# Patient Record
Sex: Female | Born: 1945 | Race: White | Hispanic: No | Smoking: Former smoker
Health system: Southern US, Community
[De-identification: ages and names within clinical notes are randomized; demographics above are authoritative.]

## PROBLEM LIST (undated history)

## (undated) DIAGNOSIS — I10 Essential (primary) hypertension: Secondary | ICD-10-CM

## (undated) DIAGNOSIS — R42 Dizziness and giddiness: Secondary | ICD-10-CM

## (undated) DIAGNOSIS — G8929 Other chronic pain: Secondary | ICD-10-CM

## (undated) DIAGNOSIS — M549 Dorsalgia, unspecified: Secondary | ICD-10-CM

## (undated) HISTORY — DX: Dizziness and giddiness: R42

## (undated) HISTORY — PX: TONSILLECTOMY AND ADENOIDECTOMY: SHX28

## (undated) HISTORY — PX: ABDOMINAL HYSTERECTOMY: SHX81

## (undated) HISTORY — DX: Essential (primary) hypertension: I10

---

## 2005-06-19 ENCOUNTER — Ambulatory Visit: Payer: Self-pay | Admitting: Internal Medicine

## 2005-06-19 ENCOUNTER — Ambulatory Visit (HOSPITAL_COMMUNITY): Admission: RE | Admit: 2005-06-19 | Discharge: 2005-06-19 | Payer: Self-pay | Admitting: Internal Medicine

## 2005-06-20 ENCOUNTER — Encounter (INDEPENDENT_AMBULATORY_CARE_PROVIDER_SITE_OTHER): Payer: Self-pay | Admitting: Internal Medicine

## 2005-07-04 ENCOUNTER — Ambulatory Visit: Payer: Self-pay | Admitting: Internal Medicine

## 2005-07-11 ENCOUNTER — Ambulatory Visit (HOSPITAL_COMMUNITY): Admission: RE | Admit: 2005-07-11 | Discharge: 2005-07-11 | Payer: Self-pay | Admitting: Internal Medicine

## 2005-07-25 ENCOUNTER — Ambulatory Visit: Payer: Self-pay | Admitting: Internal Medicine

## 2005-08-15 ENCOUNTER — Ambulatory Visit: Payer: Self-pay | Admitting: Internal Medicine

## 2005-08-28 ENCOUNTER — Ambulatory Visit: Payer: Self-pay | Admitting: *Deleted

## 2007-08-12 ENCOUNTER — Telehealth (INDEPENDENT_AMBULATORY_CARE_PROVIDER_SITE_OTHER): Payer: Self-pay | Admitting: *Deleted

## 2007-08-18 ENCOUNTER — Encounter (INDEPENDENT_AMBULATORY_CARE_PROVIDER_SITE_OTHER): Payer: Self-pay | Admitting: Internal Medicine

## 2007-08-18 DIAGNOSIS — J309 Allergic rhinitis, unspecified: Secondary | ICD-10-CM | POA: Insufficient documentation

## 2007-08-18 DIAGNOSIS — F411 Generalized anxiety disorder: Secondary | ICD-10-CM | POA: Insufficient documentation

## 2013-05-23 ENCOUNTER — Emergency Department (HOSPITAL_COMMUNITY): Payer: Medicare Other

## 2013-05-23 ENCOUNTER — Emergency Department (HOSPITAL_COMMUNITY)
Admission: EM | Admit: 2013-05-23 | Discharge: 2013-05-23 | Discharge status: Against Medical Advice | Payer: Medicare Other | Attending: Emergency Medicine | Admitting: Emergency Medicine

## 2013-05-23 ENCOUNTER — Encounter (HOSPITAL_COMMUNITY): Payer: Self-pay | Admitting: Emergency Medicine

## 2013-05-23 DIAGNOSIS — R5381 Other malaise: Secondary | ICD-10-CM | POA: Insufficient documentation

## 2013-05-23 DIAGNOSIS — R209 Unspecified disturbances of skin sensation: Secondary | ICD-10-CM | POA: Insufficient documentation

## 2013-05-23 DIAGNOSIS — R5383 Other fatigue: Secondary | ICD-10-CM | POA: Insufficient documentation

## 2013-05-23 DIAGNOSIS — Z87891 Personal history of nicotine dependence: Secondary | ICD-10-CM | POA: Insufficient documentation

## 2013-05-23 DIAGNOSIS — R42 Dizziness and giddiness: Secondary | ICD-10-CM

## 2013-05-23 DIAGNOSIS — R51 Headache: Secondary | ICD-10-CM | POA: Insufficient documentation

## 2013-05-23 DIAGNOSIS — I6509 Occlusion and stenosis of unspecified vertebral artery: Secondary | ICD-10-CM | POA: Insufficient documentation

## 2013-05-23 LAB — COMPREHENSIVE METABOLIC PANEL
Calcium: 9.5 mg/dL (ref 8.4–10.5)
Chloride: 102 mEq/L (ref 96–112)
GFR calc Af Amer: >90 mL/min (ref >90)
Potassium: 4 mEq/L (ref 3.5–5.1)
Sodium: 139 mEq/L (ref 135–145)
Total Protein: 7.5 g/dL (ref 6.0–8.3)

## 2013-05-23 LAB — CBC
MCHC: 34.4 g/dL (ref 30.0–36.0)
MCV: 92.1 fL (ref 78.0–100.0)
Platelets: 265 10*3/uL (ref 150–400)
RDW: 13.8 % (ref 11.5–15.5)

## 2013-05-23 LAB — POCT I-STAT, CHEM 8
BUN: 18 mg/dL (ref 6–23)
Calcium, Ion: 1.27 mmol/L (ref 1.13–1.30)
Chloride: 106 mEq/L (ref 96–112)
Hemoglobin: 14.3 g/dL (ref 12.0–15.0)
Sodium: 140 mEq/L (ref 135–145)

## 2013-05-23 LAB — GLUCOSE, CAPILLARY: Glucose-Capillary: 87 mg/dL (ref 70–99)

## 2013-05-23 LAB — DIFFERENTIAL
Basophils Relative: 1 % (ref 0–1)
Eosinophils Absolute: 0.1 10*3/uL (ref 0.0–0.7)
Eosinophils Relative: 2 % (ref 0–5)
Lymphocytes Relative: 43 % (ref 12–46)

## 2013-05-23 LAB — POCT I-STAT TROPONIN I: Troponin i, poc: 0 ng/mL (ref 0.00–0.08)

## 2013-05-23 LAB — PROTIME-INR: Prothrombin Time: 13.3 seconds (ref 11.6–15.2)

## 2013-05-23 LAB — TROPONIN I: Troponin I: <0.3 ng/mL (ref <0.30)

## 2013-05-23 MED ORDER — GADOBENATE DIMEGLUMINE 529 MG/ML IV SOLN
13.0000 mL | Freq: Once | INTRAVENOUS | Status: AC | PRN
  Administered 2013-05-23: 13 mL via INTRAVENOUS

## 2013-05-23 MED ORDER — MECLIZINE HCL 12.5 MG PO TABS: 12.5000 mg | ORAL_TABLET | Freq: Three times a day (TID) | ORAL | Status: DC | PRN

## 2013-05-23 NOTE — ED Provider Notes (Signed)
History     CSN: [id number]  Arrival date & time 05/23/13  4098   First MD Initiated Contact with Patient 05/23/13 1122      Chief Complaint  Patient presents with  . Dizziness    (Consider location/radiation/quality/duration/timing/severity/associated sxs/prior treatment) HPI Comments: Patient reports sudden onset headache 1 week ago she was walking. This lasted a couple minutes and was followed by a feeling of her balance being off with left arm numbness and tingling. She's had difficulty with her balance for the past week and intermittent headaches that come and go. She states the headache was sudden onset and felt like a "explosion in my head." She denies any difficulty talking or swallowing. She denies any focal weakness. She denies any vertigo. She states she feels off balance and has a difficult time walking. The feeling of "off balance" only occurs if she walks a long distance. The headaches have been intermittent since Saturday as well. She denies any other medical conditions and is not have a Dr.  The history is provided by the patient and a relative.    History reviewed. No pertinent past medical history.  History reviewed. No pertinent past surgical history.  No family history on file.  History  Substance Use Topics  . Smoking status: Former Games developer  . Smokeless tobacco: Not on file  . Alcohol Use: No    OB History   Grav Para Term Preterm Abortions TAB SAB Ect Mult Living                  Review of Systems  Constitutional: Negative for fever, activity change and appetite change.  HENT: Negative for congestion and rhinorrhea.   Respiratory: Negative for cough, chest tightness and shortness of breath.   Cardiovascular: Negative for chest pain.  Gastrointestinal: Negative for nausea, vomiting and abdominal pain.  Genitourinary: Negative for dysuria and hematuria.  Musculoskeletal: Negative for back pain.  Skin: Negative for pallor.  Neurological: Positive for  dizziness, weakness, light-headedness, numbness and headaches. Negative for syncope.  A complete 10 system review of systems was obtained and all systems are negative except as noted in the HPI and PMH.    Allergies  Fluticasone propionate  Home Medications   Current Outpatient Rx  Name  Route  Sig  Dispense  Refill  . Ascorbic Acid (VITAMIN C PO)   Oral   Take 1 tablet by mouth daily.         Marland Kitchen CALCIUM-VITAMIN D PO   Oral   Take 1 tablet by mouth daily.         Marland Kitchen MAGNESIUM PO   Oral   Take 1 tablet by mouth daily.         . Multiple Vitamin (MULTIVITAMIN WITH MINERALS) TABS   Oral   Take 1 tablet by mouth daily.         . meclizine (ANTIVERT) 12.5 MG tablet   Oral   Take 1 tablet (12.5 mg total) by mouth 3 (three) times daily as needed.   30 tablet   0     BP 147/86  Pulse 72  Temp(Src) 98.6 F (37 C) (Oral)  Resp 18  SpO2 98%  Physical Exam  Constitutional: She is oriented to person, place, and time. She appears well-developed and well-nourished. No distress.  HENT:  Head: Normocephalic and atraumatic.  Mouth/Throat: Oropharynx is clear and moist. No oropharyngeal exudate.  Eyes: Conjunctivae and EOM are normal. Pupils are equal, round, and reactive to light.  Neck: Normal range of motion. Neck supple.  Cardiovascular: Normal rate, regular rhythm and normal heart sounds.   No murmur heard. Pulmonary/Chest: Effort normal and breath sounds normal. No respiratory distress.  Abdominal: Soft. There is no tenderness. There is no rebound and no guarding.  Musculoskeletal: Normal range of motion. She exhibits no edema and no tenderness.  Neurological: She is alert and oriented to person, place, and time. No cranial nerve deficit. She exhibits normal muscle tone. Coordination normal.  CN 2-12 intact, no ataxia on finger to nose, no nystagmus, 5/5 strength throughout, no pronator drift, Romberg negative, normal gait.   Skin: Skin is warm.    ED Course   Procedures (including critical care time)  Labs Reviewed  COMPREHENSIVE METABOLIC PANEL - Abnormal; Notable for the following:    GFR calc non Af Amer 88 (*)    All other components within normal limits  POCT I-STAT, CHEM 8 - Abnormal; Notable for the following:    Glucose, Bld 100 (*)    All other components within normal limits  GLUCOSE, CAPILLARY  ETHANOL  PROTIME-INR  APTT  CBC  DIFFERENTIAL  TROPONIN I  URINE RAPID DRUG SCREEN (HOSP PERFORMED)  URINALYSIS, ROUTINE W REFLEX MICROSCOPIC  POCT I-STAT TROPONIN I   Ct Head Wo Contrast  05/23/2013   *RADIOLOGY REPORT*  Clinical Data:  Right-sided headache, dizziness, and unsteady gait.  CT HEAD WITHOUT CONTRAST  Technique: Contiguous axial images were obtained from the base of the skull through the vertex without contrast  Comparison: None  Findings:  There is no evidence of intracranial hemorrhage, brain edema, or other signs of acute infarction.  There is no evidence of intracranial mass lesion or mass effect.  No abnormal extraaxial fluid collections are identified.  There is no evidence of hydrocephalus, or other significant intracranial abnormality.  No skull abnormality identified.  IMPRESSION: Negative non-contrast head CT.   Original Report Authenticated By: Myles Rosenthal, M.D.   Mr [facility] Wo Contrast  05/23/2013   *RADIOLOGY REPORT*  Clinical Data:  Explosive sound right-side of head 1 week ago. Dizziness, headache and unsteady gait since.  MRI BRAIN WITH AND WITHOUT CONTRAST MRA HEAD WITHOUT CONTRAST MRA NECK WITHOUT AND WITH CONTRAST  Technique: Multiplanar, multiecho pulse sequences of the brain and surrounding structures were obtained according to standard protocol with and without intravenous contrast.  Angiographic images of the Circle of Willis were obtained using MRA technique without intravenous contrast.  Angiographic images of the neck were obtained using MRA technique without and with intravenous contrast.  Contrast:13 ml  MultiHance.  Comparison:05/23/2013 head CT.  MRI HEAD  Findings: No acute infarct.  No intracranial hemorrhage.  No intracranial mass or abnormal enhancement.  No hydrocephalus.  Moderate patchy and punctate nonspecific white matter type changes probably related to result of small vessel disease.  Polypoid mucosal thickening maxillary sinuses.  Mild mucosal thickening ethmoid sinus air cells.  Minimal mucosal thickening frontal sinuses.  Polypoid opacification posterior aspect right middle turbinate extends posteriorly probably incidental finding although a small polyp is not excluded.  Cervical medullary junction, pituitary region, pineal region and orbital structures unremarkable.  IMPRESSION: No acute infarct.  No intracranial hemorrhage.  No intracranial mass or abnormal enhancement.  Moderate patchy and punctate nonspecific white matter type changes probably related to result of small vessel disease.  Polypoid mucosal thickening maxillary sinuses.  Mild mucosal thickening ethmoid sinus air cells.  Minimal mucosal thickening frontal sinuses.  Minimal right mastoid air cell opacification.  Polypoid  opacification posterior aspect right middle turbinate extends posteriorly probably incidental finding although a small polyp is not excluded.  MRA HEAD  Findings:Anterior circulation without medium or large size vessel significant stenosis or occlusion.  Mild narrowing supraclinoid aspect of the internal carotid artery bilaterally.  Small bulge left posterior communicating artery origin suggestive of infundibulum.  No discrete aneurysm noted.  Mild middle cerebral artery branch vessel irregularity bilaterally.  Left vertebral artery is dominant.  No high-grade stenosis of the distal vertebral arteries or basilar artery.  Mild irregularity of the PICA bilaterally.  Nonvisualization AICA bilaterally.  Mild irregularity superior cerebellar artery bilaterally.  Mild posterior cerebral artery distal branch vessel  irregularity.  IMPRESSION: Mild intracranial atherosclerotic type changes as detailed above.  MRA NECK  Findings:Normal configuration of the origin of the great vessels from the aortic arch.  Slightly ectatic common carotid arteries and internal carotid arteries without evidence of significant narrowing, ulceration or irregularity.  Mild to moderate narrowing origin in the right vertebral artery. Mild narrowing origin left vertebral artery.  This is a common region for over estimation of the degree of narrowing.  Beyond this region, vertebral arteries are ectatic without significant stenosis or irregularity.  IMPRESSION:  Slightly ectatic common carotid arteries and internal carotid arteries without evidence of significant narrowing, ulceration or irregularity.  Mild to moderate narrowing origin in the right vertebral artery. Mild narrowing origin left vertebral artery.  This is a common region for over estimation of the degree of narrowing.  Beyond this region, vertebral arteries are ectatic without significant stenosis or irregularity.   Original Report Authenticated By: Lacy Duverney, M.D.   Mr Angiogram Neck W Wo Contrast  05/23/2013   *RADIOLOGY REPORT*  Clinical Data:  Explosive sound right-side of head 1 week ago. Dizziness, headache and unsteady gait since.  MRI BRAIN WITH AND WITHOUT CONTRAST MRA HEAD WITHOUT CONTRAST MRA NECK WITHOUT AND WITH CONTRAST  Technique: Multiplanar, multiecho pulse sequences of the brain and surrounding structures were obtained according to standard protocol with and without intravenous contrast.  Angiographic images of the Circle of Willis were obtained using MRA technique without intravenous contrast.  Angiographic images of the neck were obtained using MRA technique without and with intravenous contrast.  Contrast:13 ml MultiHance.  Comparison:05/23/2013 head CT.  MRI HEAD  Findings: No acute infarct.  No intracranial hemorrhage.  No intracranial mass or abnormal  enhancement.  No hydrocephalus.  Moderate patchy and punctate nonspecific white matter type changes probably related to result of small vessel disease.  Polypoid mucosal thickening maxillary sinuses.  Mild mucosal thickening ethmoid sinus air cells.  Minimal mucosal thickening frontal sinuses.  Polypoid opacification posterior aspect right middle turbinate extends posteriorly probably incidental finding although a small polyp is not excluded.  Cervical medullary junction, pituitary region, pineal region and orbital structures unremarkable.  IMPRESSION: No acute infarct.  No intracranial hemorrhage.  No intracranial mass or abnormal enhancement.  Moderate patchy and punctate nonspecific white matter type changes probably related to result of small vessel disease.  Polypoid mucosal thickening maxillary sinuses.  Mild mucosal thickening ethmoid sinus air cells.  Minimal mucosal thickening frontal sinuses.  Minimal right mastoid air cell opacification.  Polypoid opacification posterior aspect right middle turbinate extends posteriorly probably incidental finding although a small polyp is not excluded.  MRA HEAD  Findings:Anterior circulation without medium or large size vessel significant stenosis or occlusion.  Mild narrowing supraclinoid aspect of the internal carotid artery bilaterally.  Small bulge left posterior communicating artery origin suggestive  of infundibulum.  No discrete aneurysm noted.  Mild middle cerebral artery branch vessel irregularity bilaterally.  Left vertebral artery is dominant.  No high-grade stenosis of the distal vertebral arteries or basilar artery.  Mild irregularity of the PICA bilaterally.  Nonvisualization AICA bilaterally.  Mild irregularity superior cerebellar artery bilaterally.  Mild posterior cerebral artery distal branch vessel irregularity.  IMPRESSION: Mild intracranial atherosclerotic type changes as detailed above.  MRA NECK  Findings:Normal configuration of the origin of the  great vessels from the aortic arch.  Slightly ectatic common carotid arteries and internal carotid arteries without evidence of significant narrowing, ulceration or irregularity.  Mild to moderate narrowing origin in the right vertebral artery. Mild narrowing origin left vertebral artery.  This is a common region for over estimation of the degree of narrowing.  Beyond this region, vertebral arteries are ectatic without significant stenosis or irregularity.  IMPRESSION:  Slightly ectatic common carotid arteries and internal carotid arteries without evidence of significant narrowing, ulceration or irregularity.  Mild to moderate narrowing origin in the right vertebral artery. Mild narrowing origin left vertebral artery.  This is a common region for over estimation of the degree of narrowing.  Beyond this region, vertebral arteries are ectatic without significant stenosis or irregularity.   Original Report Authenticated By: Lacy Duverney, M.D.   Mr [facility] Wo Contrast  05/23/2013   *RADIOLOGY REPORT*  Clinical Data:  Explosive sound right-side of head 1 week ago. Dizziness, headache and unsteady gait since.  MRI BRAIN WITH AND WITHOUT CONTRAST MRA HEAD WITHOUT CONTRAST MRA NECK WITHOUT AND WITH CONTRAST  Technique: Multiplanar, multiecho pulse sequences of the brain and surrounding structures were obtained according to standard protocol with and without intravenous contrast.  Angiographic images of the Circle of Willis were obtained using MRA technique without intravenous contrast.  Angiographic images of the neck were obtained using MRA technique without and with intravenous contrast.  Contrast:13 ml MultiHance.  Comparison:05/23/2013 head CT.  MRI HEAD  Findings: No acute infarct.  No intracranial hemorrhage.  No intracranial mass or abnormal enhancement.  No hydrocephalus.  Moderate patchy and punctate nonspecific white matter type changes probably related to result of small vessel disease.  Polypoid mucosal  thickening maxillary sinuses.  Mild mucosal thickening ethmoid sinus air cells.  Minimal mucosal thickening frontal sinuses.  Polypoid opacification posterior aspect right middle turbinate extends posteriorly probably incidental finding although a small polyp is not excluded.  Cervical medullary junction, pituitary region, pineal region and orbital structures unremarkable.  IMPRESSION: No acute infarct.  No intracranial hemorrhage.  No intracranial mass or abnormal enhancement.  Moderate patchy and punctate nonspecific white matter type changes probably related to result of small vessel disease.  Polypoid mucosal thickening maxillary sinuses.  Mild mucosal thickening ethmoid sinus air cells.  Minimal mucosal thickening frontal sinuses.  Minimal right mastoid air cell opacification.  Polypoid opacification posterior aspect right middle turbinate extends posteriorly probably incidental finding although a small polyp is not excluded.  MRA HEAD  Findings:Anterior circulation without medium or large size vessel significant stenosis or occlusion.  Mild narrowing supraclinoid aspect of the internal carotid artery bilaterally.  Small bulge left posterior communicating artery origin suggestive of infundibulum.  No discrete aneurysm noted.  Mild middle cerebral artery branch vessel irregularity bilaterally.  Left vertebral artery is dominant.  No high-grade stenosis of the distal vertebral arteries or basilar artery.  Mild irregularity of the PICA bilaterally.  Nonvisualization AICA bilaterally.  Mild irregularity superior cerebellar artery bilaterally.  Mild posterior  cerebral artery distal branch vessel irregularity.  IMPRESSION: Mild intracranial atherosclerotic type changes as detailed above.  MRA NECK  Findings:Normal configuration of the origin of the great vessels from the aortic arch.  Slightly ectatic common carotid arteries and internal carotid arteries without evidence of significant narrowing, ulceration or  irregularity.  Mild to moderate narrowing origin in the right vertebral artery. Mild narrowing origin left vertebral artery.  This is a common region for over estimation of the degree of narrowing.  Beyond this region, vertebral arteries are ectatic without significant stenosis or irregularity.  IMPRESSION:  Slightly ectatic common carotid arteries and internal carotid arteries without evidence of significant narrowing, ulceration or irregularity.  Mild to moderate narrowing origin in the right vertebral artery. Mild narrowing origin left vertebral artery.  This is a common region for over estimation of the degree of narrowing.  Beyond this region, vertebral arteries are ectatic without significant stenosis or irregularity.   Original Report Authenticated By: Lacy Duverney, M.D.     1. Dizziness       MDM  1 week history of "balance problems" that come on with walking. Preceded by severe sudden onset headache 1 week ago. Has had recurrent headaches episodically since then.  CT Negative. Patient's neurological exam is nonfocal. She denies any current headache.  She adamantly refuses lumbar puncture to evaluate for subarachnoid hemorrhage. Patient understands that there is a small risk of ongoing subarachnoid hemorrhage that could be missed.  Given patient's feeling of "off balance", we'll proceed with MRI to rule out stroke. Though she denies vertiginous symptoms. MRI shows no acute infarct. No evidence of hemorrhage. There is mild narrowing of her vertebral arteries at the origin bilaterally. Carotid arteries appear to be patent. Results d/w Dr. Roseanne Reno who agrees no explanation for patient's symptoms and as she is ambulatory, she is stable for outpatient followup.  Patient's exam is nonfocal and she is able to ambulate without assistance. She denies any dizziness or vertigo. She denies any vision change. She denies any focal weakness, numbness or tingling. I spoke with the patient there is still  concern about her "explosive" headache last week. She continues to refuse lumbar puncture and understands there is a tiny risk of missed subarachnoid hemorrhage. She understands she has potential to have worsening headache, confusion or death. She is leaving AGAINST MEDICAL ADVICE.    Date: 05/23/2013  Rate: 61  Rhythm: normal sinus rhythm  QRS Axis: normal  Intervals: normal  ST/T Wave abnormalities: nonspecific ST/T changes  Conduction Disutrbances:left bundle branch block  Narrative Interpretation:   Old EKG Reviewed: none available       Glynn Octave, MD 05/23/13 1558

## 2013-05-23 NOTE — ED Notes (Signed)
Pt transported to MRI 

## 2013-05-23 NOTE — ED Notes (Signed)
CBG 87. 

## 2013-05-23 NOTE — ED Notes (Signed)
Pt returned to exam room. Resting quietly at the time. No change in neurological status. She is alert and oriented x4. Denies pain. Family at bedside. EDP also at bedside to discuss plan of care.

## 2013-05-23 NOTE — ED Notes (Signed)
Pt reports went for a walk last Saturday and had sudden sensation of her balance being off, left arm numbness and tingling. Symptoms continue today. Pt does not have PMD. Pt talking in complete sentences without difficulty.

## 2013-05-23 NOTE — ED Notes (Signed)
Pt transported to CT ?

## 2013-05-23 NOTE — ED Notes (Signed)
No change in neurological status. Vital signs stable. Pt remains conscious alert and oriented x4.

## 2013-05-23 NOTE — ED Notes (Signed)
Pt left AMA. Signed form. Has no further questions.

## 2013-06-03 ENCOUNTER — Encounter: Payer: Self-pay | Admitting: Neurology

## 2013-06-03 ENCOUNTER — Ambulatory Visit (INDEPENDENT_AMBULATORY_CARE_PROVIDER_SITE_OTHER): Payer: Medicare Other | Admitting: Neurology

## 2013-06-03 VITALS — BP 124/79 | HR 81 | Ht 62.0 in | Wt 134.0 lb

## 2013-06-03 DIAGNOSIS — R269 Unspecified abnormalities of gait and mobility: Secondary | ICD-10-CM

## 2013-06-03 NOTE — Progress Notes (Signed)
[facility]  PATIENT: Jessica Davis DOB: 03/23/1946  HISTORICAL That is a 67 years old right-handed Caucasian female, referred by emergency room physician evaluation of gait difficulty  In June 7th,  while taking her routine morning walk around 8:00, she suddenly felt a shooting sensation from her right neck, right skull, was quite, striking, lasting less than 1 minutes, no loss of consciousness, but she had gait difficulty, bilateral lower extremity heaviness, numbness, she has to crawl back to home, her balance has been off since.  it was intermittent, especially when she turning around, she sudden he felt lightheaded, wavy sensation,  She was previously healthy, has not seen physician for many years, but had few years history of worsening urinary incontinence.  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 Fluticasone Propionate     Years ago. Reaction was unknown    HOME MEDICATIONS: Outpatient Prescriptions Prior to Visit  Medication Sig Dispense Refill  . Ascorbic Acid (VITAMIN C PO) Take 1 tablet by mouth daily.      Marland Kitchen CALCIUM-VITAMIN D PO Take 1 tablet by mouth daily.      Marland Kitchen MAGNESIUM PO Take 1 tablet by mouth daily.      . Multiple Vitamin (MULTIVITAMIN WITH MINERALS) TABS Take 1 tablet by mouth daily.      . meclizine (ANTIVERT) 12.5 MG tablet Take 1 tablet (12.5 mg total) by mouth 3 (three) times daily as needed.  30 tablet  0   No facility-administered medications prior to visit.    PAST MEDICAL HISTORY: Past Medical History  Diagnosis Date  . Dizziness     PAST SURGICAL HISTORY: Past Surgical History  Procedure Laterality Date  .  Abdominal hysterectomy    . Tonsillectomy and adenoidectomy      FAMILY HISTORY: History reviewed. No pertinent family history.  SOCIAL HISTORY:  History   Social History  . Marital Status: Legally Separated    Spouse Name: N/A    Number of Children: 1  . Years of Education: BA   Occupational History  .      Retired   Social History Main Topics  . Smoking status: Former Games developer  . Smokeless tobacco: Never Used  . Alcohol Use: 0.5 oz/week    1 drink(s) per week     Comment: OCC  once a year  . Drug Use: No  . Sexually Active: Not on file   Other Topics Concern  . Not on file   Social History Narrative   Patient is retired and lives at home alone college education. Patient has one child. Patient right handed.   Caffeine -one daily.     PHYSICAL EXAM  Filed Vitals:   06/03/13 1350  BP: 124/79  Pulse: 81  Height: 5\' 2"  (1.575 m)  Weight: 134 lb (60.782 kg)    Not recorded    Body mass index is 24.5 kg/(m^2).   Generalized: In no acute distress  Neck: Supple, no carotid bruits   Cardiac: Regular rate rhythm  Pulmonary: Clear to auscultation bilaterally  Musculoskeletal: No deformity  Neurological examination  Mentation: Alert oriented to time, place, history taking, and causual conversation  Cranial nerve  II-XII: Pupils were equal round reactive to light extraocular movements were full, visual field were full on confrontational test. facial sensation and strength were normal. hearing was intact to finger rubbing bilaterally. Uvula tongue midline.  head turning and shoulder shrug and were normal and symmetric.Tongue protrusion into cheek strength was normal.  Motor: normal tone, bulk and strength.  Sensory: Intact to fine touch, pinprick, preserved vibratory sensation, and proprioception at toes.  Coordination: Normal finger to nose, heel-to-shin bilaterally there was no truncal ataxia  Gait: Rising up from seated position without assistance,  normal stance, without trunk ataxia, moderate stride, good arm swing, she complains of dizziness when turning.   Romberg signs: Negative  Deep tendon reflexes: Brachioradialis 2/2, biceps 2/2, triceps 2/2, patellar 3/3, Achilles 2/2, plantar responses were flexor bilaterally.   DIAGNOSTIC DATA (LABS, IMAGING, TESTING) - I reviewed patient records, labs, notes, testing and imaging myself where available.  Lab Results  Component Value Date   WBC 5.1 05/23/2013   HGB 13.3 05/23/2013   HCT 38.7 05/23/2013   MCV 92.1 05/23/2013   PLT 265 05/23/2013      Component Value Date/Time   NA 139 05/23/2013 1150   K 4.0 05/23/2013 1150   CL 102 05/23/2013 1150   CO2 26 05/23/2013 1150   GLUCOSE 95 05/23/2013 1150   BUN 16 05/23/2013 1150   CREATININE 0.70 05/23/2013 1150   CALCIUM 9.5 05/23/2013 1150   PROT 7.5 05/23/2013 1150   ALBUMIN 4.3 05/23/2013 1150   AST 23 05/23/2013 1150   ALT 16 05/23/2013 1150   ALKPHOS 82 05/23/2013 1150   BILITOT 0.3 05/23/2013 1150   GFRNONAA 88* 05/23/2013 1150   GFRAA >90 05/23/2013 1150   No results found for this basename: CHOL, HDL, LDLCALC, LDLDIRECT, TRIG, CHOLHDL   No results found for this basename: HGBA1C   No results found for this basename: VITAMINB12   Lab Results  Component Value Date   TSH 1.473 06/20/2005    ASSESSMENT AND PLAN   67 years old Caucasian female, with acute onset of unsteady gait, on examination, she has brisk reflex, neck pain, only mild abnormality, small vessel disease on MRI of the brain, which would not explain her gait difficulty,she complains few years history of worsening urinary incontinence.  1. we will proceed with MRI of cervical spine to rule out spondylitic myelopathy,  2. Physical therapy  Levert Feinstein, M.D. Ph.D.  [facility] [address], [address] [place], [place] [address] [phone]

## 2013-06-08 ENCOUNTER — Ambulatory Visit: Payer: Medicare Other | Admitting: Neurology

## 2013-06-11 ENCOUNTER — Ambulatory Visit
Admission: RE | Admit: 2013-06-11 | Discharge: 2013-06-11 | Disposition: A | Discharge status: Home / Self Care | Payer: Medicare Other | Source: Ambulatory Visit | Attending: Neurology | Admitting: Neurology

## 2013-06-11 DIAGNOSIS — R269 Unspecified abnormalities of gait and mobility: Secondary | ICD-10-CM

## 2013-06-16 NOTE — Progress Notes (Signed)
Quick Note:  Please call patient, MRI cervical showed multi-level degenerative disease, no evidence of cord compression. She is to continue physical therapy, give her a follow up within 3-4 months ______

## 2013-06-17 ENCOUNTER — Ambulatory Visit: Payer: Medicare Other | Attending: Neurology | Admitting: Rehabilitative and Restorative Service Providers"

## 2013-06-17 DIAGNOSIS — R269 Unspecified abnormalities of gait and mobility: Secondary | ICD-10-CM | POA: Insufficient documentation

## 2013-06-17 DIAGNOSIS — IMO0001 Reserved for inherently not codable concepts without codable children: Secondary | ICD-10-CM | POA: Insufficient documentation

## 2013-06-17 DIAGNOSIS — R42 Dizziness and giddiness: Secondary | ICD-10-CM | POA: Insufficient documentation

## 2013-06-17 NOTE — Progress Notes (Signed)
Quick Note:  Left a message on the pt's home voice mail regarding her recent MRI findings and her next appointment with Dr Terrace Arabia on 09/28/2013 at 3:30 PM. Contact information was given so that she may call with any questions or concerns.   ______

## 2013-06-18 ENCOUNTER — Ambulatory Visit: Payer: Medicare Other | Admitting: Neurology

## 2013-06-26 ENCOUNTER — Ambulatory Visit: Payer: Medicare Other | Admitting: Rehabilitative and Restorative Service Providers"

## 2013-06-29 ENCOUNTER — Ambulatory Visit: Payer: Medicare Other | Admitting: Rehabilitative and Restorative Service Providers"

## 2013-07-06 ENCOUNTER — Encounter: Payer: Medicare Other | Admitting: Rehabilitative and Restorative Service Providers"

## 2013-07-13 ENCOUNTER — Encounter: Payer: Medicare Other | Admitting: Rehabilitative and Restorative Service Providers"

## 2013-07-27 ENCOUNTER — Ambulatory Visit: Payer: Medicare Other | Admitting: Rehabilitative and Restorative Service Providers"

## 2013-08-03 ENCOUNTER — Encounter: Payer: Medicare Other | Admitting: Rehabilitative and Restorative Service Providers"

## 2013-08-05 ENCOUNTER — Telehealth: Payer: Self-pay | Admitting: Neurology

## 2013-08-05 NOTE — Telephone Encounter (Signed)
I received a message from the billing department that the patient was upset and wanted her MRI results and wanted to cancel her 09-28-13 appointment.  On 06-17-13 there was a telephone note in her chart that a voice message was left on home number to the patient with the results of the MRI cervical and the note said to call with further questions.  I called patient today and also got an answering machine at the home number of [phone] and left a detailed message that her results were called and left on VM on 06-17-13, I also relayed the results again on the message and told her I was going to keep the upcoming appointment, and asked her to call my extension with further questions or if she still wanted to cancel the appointment.

## 2013-09-28 ENCOUNTER — Ambulatory Visit: Payer: Self-pay | Admitting: Neurology

## 2015-04-25 ENCOUNTER — Ambulatory Visit: Payer: Medicare Other | Admitting: Internal Medicine

## 2018-09-22 ENCOUNTER — Other Ambulatory Visit: Payer: Self-pay

## 2018-09-22 ENCOUNTER — Emergency Department (HOSPITAL_COMMUNITY)
Admission: EM | Admit: 2018-09-22 | Discharge: 2018-09-22 | Disposition: A | Discharge status: Home / Self Care | Payer: Medicare Other | Attending: Emergency Medicine | Admitting: Emergency Medicine

## 2018-09-22 ENCOUNTER — Emergency Department (HOSPITAL_COMMUNITY): Payer: Medicare Other

## 2018-09-22 ENCOUNTER — Encounter (HOSPITAL_COMMUNITY): Payer: Self-pay | Admitting: *Deleted

## 2018-09-22 DIAGNOSIS — Z79899 Other long term (current) drug therapy: Secondary | ICD-10-CM | POA: Insufficient documentation

## 2018-09-22 DIAGNOSIS — Z87891 Personal history of nicotine dependence: Secondary | ICD-10-CM | POA: Diagnosis not present

## 2018-09-22 DIAGNOSIS — I10 Essential (primary) hypertension: Secondary | ICD-10-CM | POA: Insufficient documentation

## 2018-09-22 HISTORY — DX: Dorsalgia, unspecified: M54.9

## 2018-09-22 HISTORY — DX: Other chronic pain: G89.29

## 2018-09-22 LAB — CBC
HEMATOCRIT: 41.7 % (ref 36.0–46.0)
HEMOGLOBIN: 12.9 g/dL (ref 12.0–15.0)
MCH: 29.8 pg (ref 26.0–34.0)
MCHC: 30.9 g/dL (ref 30.0–36.0)
MCV: 96.3 fL (ref 80.0–100.0)
NRBC: 0 % (ref 0.0–0.2)
Platelets: 254 10*3/uL (ref 150–400)
RBC: 4.33 MIL/uL (ref 3.87–5.11)
RDW: 13.5 % (ref 11.5–15.5)
WBC: 4.8 10*3/uL (ref 4.0–10.5)

## 2018-09-22 LAB — BASIC METABOLIC PANEL
ANION GAP: 8 (ref 5–15)
BUN: 10 mg/dL (ref 8–23)
CALCIUM: 9.4 mg/dL (ref 8.9–10.3)
CHLORIDE: 103 mmol/L (ref 98–111)
CO2: 26 mmol/L (ref 22–32)
Creatinine, Ser: 0.73 mg/dL (ref 0.44–1.00)
GFR calc non Af Amer: >60 mL/min (ref >60)
Glucose, Bld: 99 mg/dL (ref 70–99)
POTASSIUM: 4.5 mmol/L (ref 3.5–5.1)
Sodium: 137 mmol/L (ref 135–145)

## 2018-09-22 MED ORDER — AMLODIPINE BESYLATE 5 MG PO TABS
5.0000 mg | ORAL_TABLET | Freq: Once | ORAL | Status: AC
  Administered 2018-09-22: 5 mg via ORAL
  Filled 2018-09-22: qty 1

## 2018-09-22 MED ORDER — AMLODIPINE BESYLATE 5 MG PO TABS: 5.0000 mg | ORAL_TABLET | Freq: Every day | ORAL | 0 refills | Status: DC

## 2018-09-22 NOTE — ED Provider Notes (Signed)
[facility] [place] EMERGENCY DEPARTMENT Provider Note   CSN: [id number] Arrival date & time: 09/22/18  1112     History   Chief Complaint No chief complaint on file.   HPI Jessica Davis is a 72 y.o. female.  Patient c/o being concerned that home bp was 200/ this AM. States took bp several times, started at 170/ and then got higher. Also has noted 'rattling' in chest in past month and wants that checked. Occasional non productive cough. No fever or chills. Former smoker. Denies hx asthma or copd. Denies chest pain or discomfort. No leg swelling or pain. States bp has been high in past, but has not required meds.  The history is provided by the patient.    Past Medical History:  Diagnosis Date  . Dizziness     Patient Active Problem List   Diagnosis Date Noted  . Abnormality of gait 06/03/2013  . ANXIETY 08/18/2007  . ALLERGIC RHINITIS 08/18/2007    Past Surgical History:  Procedure Laterality Date  . ABDOMINAL HYSTERECTOMY    . TONSILLECTOMY AND ADENOIDECTOMY       OB History   None      Home Medications    Prior to Admission medications   Medication Sig Start Date End Date Taking? Authorizing Provider  Ascorbic Acid (VITAMIN C PO) Take 1 tablet by mouth daily.    [provider]  CALCIUM-VITAMIN D PO Take 1 tablet by mouth daily.    [provider]  MAGNESIUM PO Take 1 tablet by mouth daily.    [provider]  Multiple Vitamin (MULTIVITAMIN WITH MINERALS) TABS Take 1 tablet by mouth daily.    [provider]    Family History No family history on file.  Social History Social History   Tobacco Use  . Smoking status: Former Games developer  . Smokeless tobacco: Never Used  Substance Use Topics  . Alcohol use: Yes    Alcohol/week: 1.0 standard drinks    Types: 1 drink(s) per week    Comment: OCC  once a year  . Drug use: No     Allergies   Fluticasone propionate   Review of Systems Review of Systems    Constitutional: Negative for fever.  HENT: Negative for sore throat.   Eyes: Negative for redness.  Respiratory: Positive for cough. Negative for shortness of breath.   Cardiovascular: Negative for chest pain and leg swelling.  Gastrointestinal: Negative for abdominal pain and vomiting.  Genitourinary: Negative for flank pain.  Musculoskeletal: Negative for back pain and neck pain.  Skin: Negative for rash.  Neurological: Negative for headaches.  Hematological: Does not bruise/bleed easily.  Psychiatric/Behavioral: Negative for confusion.     Physical Exam Updated Vital Signs BP (!) 156/68   Pulse 68   Temp 97.8 F (36.6 C) (Oral)   Resp 18   SpO2 98%   Physical Exam  Constitutional: She appears well-developed and well-nourished.  HENT:  Head: Atraumatic.  Eyes: Conjunctivae are normal. No scleral icterus.  Neck: Neck supple. No tracheal deviation present. No thyromegaly present.  Cardiovascular: Normal rate, regular rhythm, normal heart sounds and intact distal pulses. Exam reveals no gallop and no friction rub.  No murmur heard. Pulmonary/Chest: Effort normal and breath sounds normal. No respiratory distress.  Abdominal: Soft. Normal appearance and bowel sounds are normal. She exhibits no distension. There is no tenderness.  Musculoskeletal: She exhibits no edema or tenderness.  Neurological: She is alert.  Speech clear/fluent. Ambulates w steady gait.  Skin: Skin is warm and dry. No rash noted.  Psychiatric: She has a normal mood and affect.  Nursing note and vitals reviewed.    ED Treatments / Results  Labs (all labs ordered are listed, but only abnormal results are displayed) Results for orders placed or performed during the hospital encounter of 09/22/18  CBC  Result Value Ref Range   WBC 4.8 4.0 - 10.5 K/uL   RBC 4.33 3.87 - 5.11 MIL/uL   Hemoglobin 12.9 12.0 - 15.0 g/dL   HCT 16.1 09.6 - 04.5 %   MCV 96.3 80.0 - 100.0 fL   MCH 29.8 26.0 - 34.0 pg    MCHC 30.9 30.0 - 36.0 g/dL   RDW 40.9 81.1 - 91.4 %   Platelets 254 150 - 400 K/uL   nRBC 0.0 0.0 - 0.2 %  Basic metabolic panel  Result Value Ref Range   Sodium 137 135 - 145 mmol/L   Potassium 4.5 3.5 - 5.1 mmol/L   Chloride 103 98 - 111 mmol/L   CO2 26 22 - 32 mmol/L   Glucose, Bld 99 70 - 99 mg/dL   BUN 10 8 - 23 mg/dL   Creatinine, Ser 7.82 0.44 - 1.00 mg/dL   Calcium 9.4 8.9 - 95.6 mg/dL   GFR calc non Af Amer >60 >60 mL/min   GFR calc Af Amer >60 >60 mL/min   Anion gap 8 5 - 15   Dg Chest 2 View  Result Date: 09/22/2018 CLINICAL DATA:  Onset central chest pain today. EXAM: CHEST - 2 VIEW COMPARISON:  PA and lateral chest 06/19/2005. FINDINGS: The lungs are clear. Heart size is normal. Aortic atherosclerosis is noted. No pneumothorax or pleural fluid. No acute or focal bony abnormality. IMPRESSION: No acute disease. Atherosclerosis. Electronically Signed   By: Drusilla Kanner M.D.   On: 09/22/2018 12:09    EKG EKG Interpretation  Date/Time:  Monday September 22 2018 11:25:03 EDT Ventricular Rate:  71 PR Interval:    QRS Duration: 167 QT Interval:  449 QTC Calculation: 488 R Axis:   -57 Text Interpretation:  Sinus rhythm Left bundle branch block No previous tracing Confirmed by Cathren Laine ([id number]) on 09/22/2018 12:08:38 PM   Radiology Dg Chest 2 View  Result Date: 09/22/2018 CLINICAL DATA:  Onset central chest pain today. EXAM: CHEST - 2 VIEW COMPARISON:  PA and lateral chest 06/19/2005. FINDINGS: The lungs are clear. Heart size is normal. Aortic atherosclerosis is noted. No pneumothorax or pleural fluid. No acute or focal bony abnormality. IMPRESSION: No acute disease. Atherosclerosis. Electronically Signed   By: Drusilla Kanner M.D.   On: 09/22/2018 12:09    Procedures Procedures (including critical care time)  Medications Ordered in ED Medications  amLODipine (NORVASC) tablet 5 mg (has no administration in time range)     Initial Impression / Assessment  and Plan / ED Course  I have reviewed the triage vital signs and the nursing notes.  Pertinent labs & imaging results that were available during my care of the patient were reviewed by me and considered in my medical decision making (see chart for details).  Labs.   Reviewed nursing notes and prior charts for additional history.   Recheck bp - remains elevated. Amlodipine 5 mg po.  Cxr.  Recheck, labs reviewed - chem normal. cxr reviewed - no pna.   bp improved.   Pt currently appears stable for d/c.   Final Clinical Impressions(s) / ED Diagnoses   Final diagnoses:  None  ED Discharge Orders    None       Cathren Laine, MD 09/22/18 1512

## 2018-09-22 NOTE — Discharge Instructions (Addendum)
It was our pleasure to provide your ER care today - we hope that you feel better.  Your blood pressure is high - take blood pressure medication as prescribed, limit salt intake, and follow up with primary care doctor in the next 1-2 weeks.  Your chest xray is clear, no pneumonia - you may have a viral upper respiratory illness leading to the rattling sensation you experience.   Follow up with primary care doctor in the next 1-2 weeks for recheck.  Return to ER if worse, new symptoms, increased trouble breathing, other concern.

## 2018-09-22 NOTE — ED Triage Notes (Signed)
Patient presents to ed via [facility]  C/O elevated blood pressure at home  C/o fluttering in her chest  At times.

## 2019-07-01 IMAGING — DX DG CHEST 2V
2 series · 2 of 2 positions shown · non-contrast
Comparison: PA and lateral chest 06/19/2005.

CLINICAL DATA: Onset central chest pain today.

EXAM:
CHEST - 2 VIEW

[chest ap]
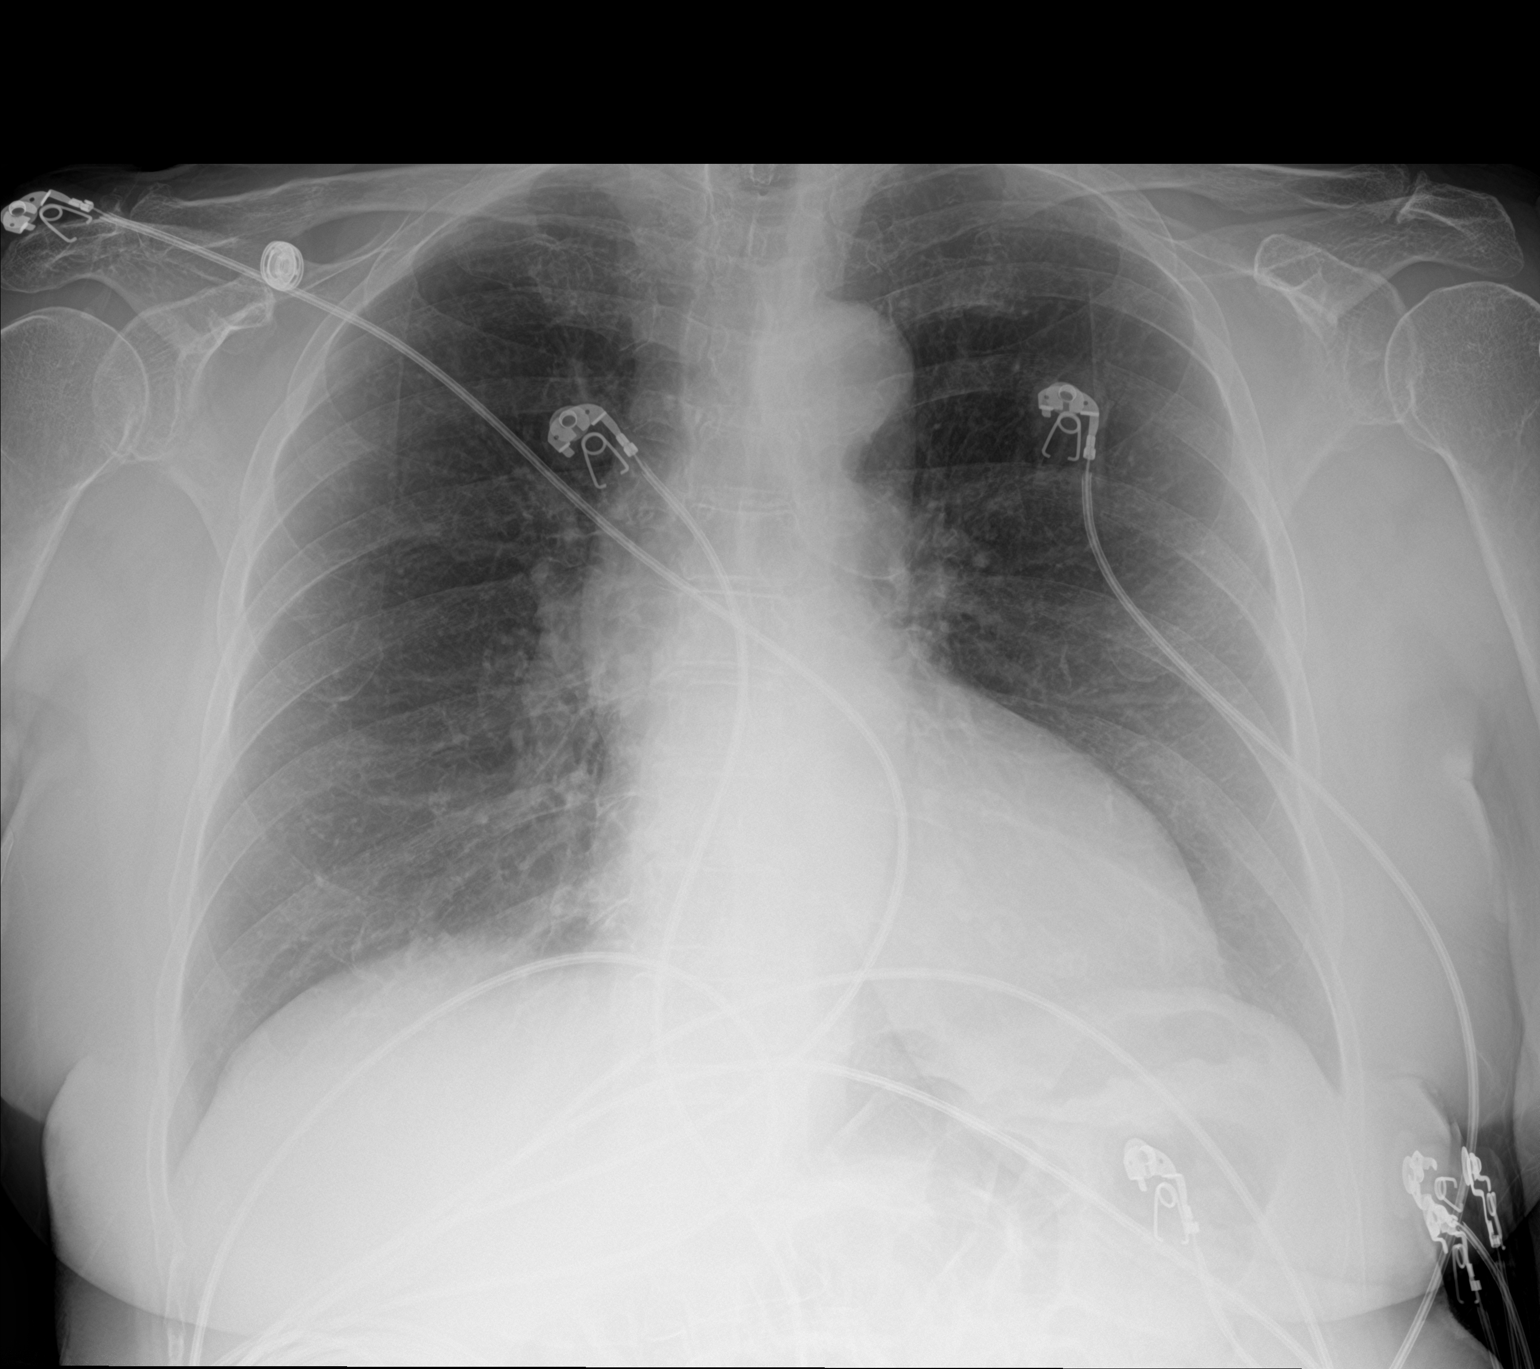

[chest lat]
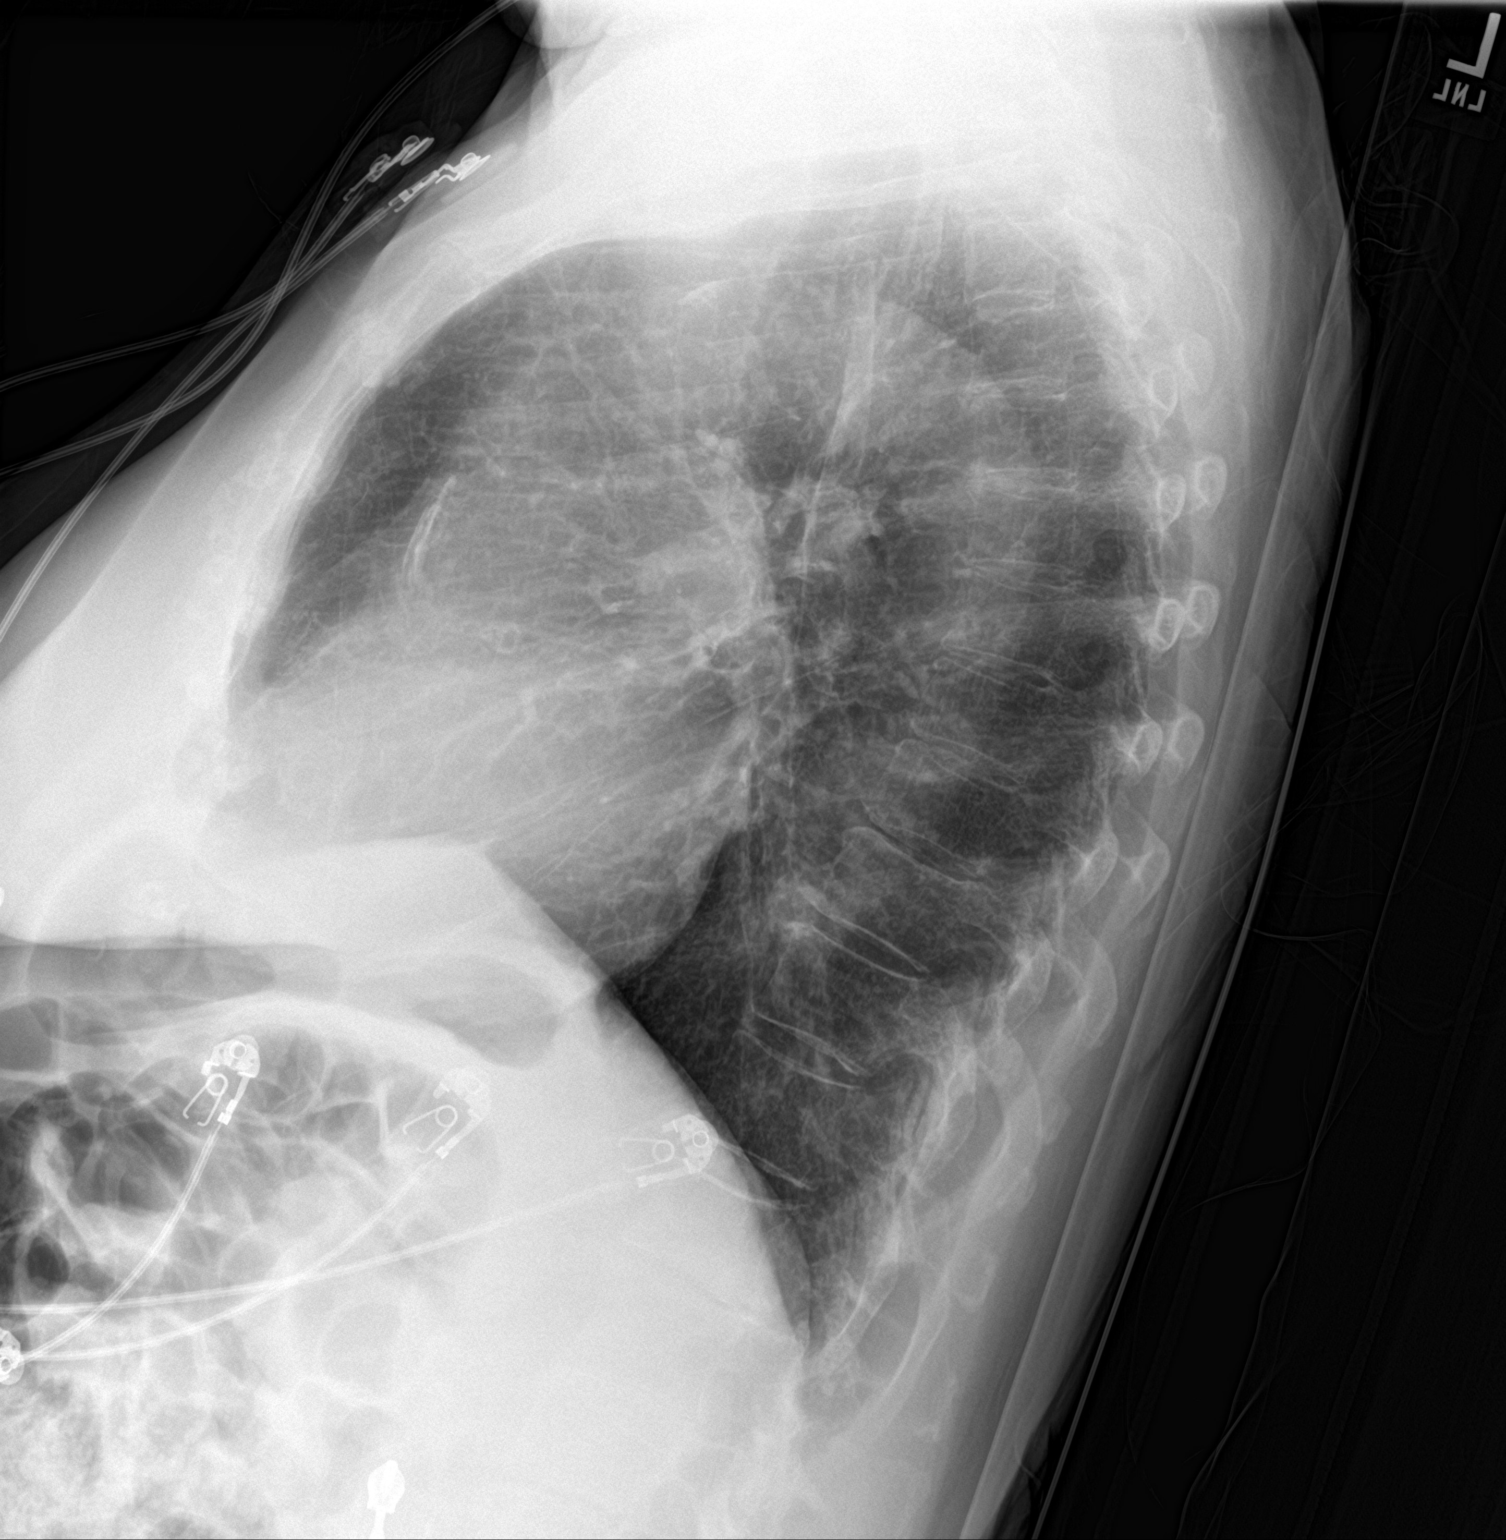

[2 of 2 positions shown; findings below may reference images not displayed]

FINDINGS: The lungs are clear. Heart size is normal. Aortic atherosclerosis is
noted. No pneumothorax or pleural fluid. No acute or focal bony
abnormality.
IMPRESSION: No acute disease.

Atherosclerosis.

## 2020-05-26 NOTE — Progress Notes (Signed)
 Subjective:  Patient ID: Jessica Davis is a 74 y.o. female.  HPI  HPI       Review of Systems  Social History   Tobacco Use  Smoking Status Former Smoker  Smokeless Tobacco Never Used   History reviewed. No pertinent past medical history. History reviewed. No pertinent surgical history. History reviewed. No pertinent family history. Objective: Physical Exam   Assessment/Plan:  Patient in today for COVID antibody testing.Here because she felt she had COVID this past Decmember. No chronic health conditions Nonsmoker No symptoms                                     Screening completed. Results and lifestyle recommendations reviewed with patient in accordance with [facility] guidelines.  [place] Department of Health notified of results per regulations

## 2024-10-10 DIAGNOSIS — I509 Heart failure, unspecified: Secondary | ICD-10-CM

## 2024-10-10 HISTORY — DX: Heart failure, unspecified: I50.9

## 2024-10-23 ENCOUNTER — Inpatient Hospital Stay (HOSPITAL_BASED_OUTPATIENT_CLINIC_OR_DEPARTMENT_OTHER)
Admission: EM | Admit: 2024-10-23 | Discharge: 2024-10-27 | DRG: 286 | Disposition: A | Discharge status: Home / Self Care | Attending: Emergency Medicine | Admitting: Emergency Medicine

## 2024-10-23 ENCOUNTER — Other Ambulatory Visit: Payer: Self-pay

## 2024-10-23 ENCOUNTER — Encounter (HOSPITAL_BASED_OUTPATIENT_CLINIC_OR_DEPARTMENT_OTHER): Payer: Self-pay | Admitting: Emergency Medicine

## 2024-10-23 ENCOUNTER — Emergency Department (HOSPITAL_BASED_OUTPATIENT_CLINIC_OR_DEPARTMENT_OTHER): Admitting: Radiology

## 2024-10-23 DIAGNOSIS — R7401 Elevation of levels of liver transaminase levels: Secondary | ICD-10-CM | POA: Diagnosis present

## 2024-10-23 DIAGNOSIS — I5021 Acute systolic (congestive) heart failure: Secondary | ICD-10-CM | POA: Diagnosis present

## 2024-10-23 DIAGNOSIS — I5041 Acute combined systolic (congestive) and diastolic (congestive) heart failure: Secondary | ICD-10-CM | POA: Diagnosis not present

## 2024-10-23 DIAGNOSIS — I110 Hypertensive heart disease with heart failure: Secondary | ICD-10-CM | POA: Diagnosis present

## 2024-10-23 DIAGNOSIS — E785 Hyperlipidemia, unspecified: Secondary | ICD-10-CM | POA: Diagnosis present

## 2024-10-23 DIAGNOSIS — Z79899 Other long term (current) drug therapy: Secondary | ICD-10-CM | POA: Diagnosis not present

## 2024-10-23 DIAGNOSIS — G4700 Insomnia, unspecified: Secondary | ICD-10-CM | POA: Diagnosis present

## 2024-10-23 DIAGNOSIS — G8929 Other chronic pain: Secondary | ICD-10-CM | POA: Diagnosis present

## 2024-10-23 DIAGNOSIS — Z5982 Transportation insecurity: Secondary | ICD-10-CM

## 2024-10-23 DIAGNOSIS — E669 Obesity, unspecified: Secondary | ICD-10-CM | POA: Insufficient documentation

## 2024-10-23 DIAGNOSIS — Z7982 Long term (current) use of aspirin: Secondary | ICD-10-CM

## 2024-10-23 DIAGNOSIS — I428 Other cardiomyopathies: Secondary | ICD-10-CM | POA: Diagnosis present

## 2024-10-23 DIAGNOSIS — Z87891 Personal history of nicotine dependence: Secondary | ICD-10-CM

## 2024-10-23 DIAGNOSIS — K219 Gastro-esophageal reflux disease without esophagitis: Secondary | ICD-10-CM | POA: Diagnosis present

## 2024-10-23 DIAGNOSIS — Z8249 Family history of ischemic heart disease and other diseases of the circulatory system: Secondary | ICD-10-CM

## 2024-10-23 DIAGNOSIS — I509 Heart failure, unspecified: Secondary | ICD-10-CM | POA: Diagnosis not present

## 2024-10-23 DIAGNOSIS — I25110 Atherosclerotic heart disease of native coronary artery with unstable angina pectoris: Secondary | ICD-10-CM | POA: Diagnosis present

## 2024-10-23 DIAGNOSIS — M549 Dorsalgia, unspecified: Secondary | ICD-10-CM | POA: Diagnosis present

## 2024-10-23 DIAGNOSIS — I10 Essential (primary) hypertension: Secondary | ICD-10-CM | POA: Diagnosis not present

## 2024-10-23 DIAGNOSIS — I447 Left bundle-branch block, unspecified: Secondary | ICD-10-CM | POA: Diagnosis present

## 2024-10-23 DIAGNOSIS — I2510 Atherosclerotic heart disease of native coronary artery without angina pectoris: Secondary | ICD-10-CM | POA: Diagnosis not present

## 2024-10-23 DIAGNOSIS — Z888 Allergy status to other drugs, medicaments and biological substances status: Secondary | ICD-10-CM | POA: Diagnosis not present

## 2024-10-23 LAB — BASIC METABOLIC PANEL WITH GFR
Anion gap: 11 (ref 5–15)
BUN: 16 mg/dL (ref 8–23)
CO2: 25 mmol/L (ref 22–32)
Calcium: 9.9 mg/dL (ref 8.9–10.3)
Chloride: 96 mmol/L — ABNORMAL LOW (ref 98–111)
Creatinine, Ser: 0.66 mg/dL (ref 0.44–1.00)
GFR, Estimated: >60 mL/min (ref >60)
Glucose, Bld: 102 mg/dL — ABNORMAL HIGH (ref 70–99)
Potassium: 4.2 mmol/L (ref 3.5–5.1)
Sodium: 133 mmol/L — ABNORMAL LOW (ref 135–145)

## 2024-10-23 LAB — HEPATIC FUNCTION PANEL
ALT: 101 U/L — ABNORMAL HIGH (ref 0–44)
AST: 70 U/L — ABNORMAL HIGH (ref 15–41)
Albumin: 4.4 g/dL (ref 3.5–5.0)
Alkaline Phosphatase: 72 U/L (ref 38–126)
Bilirubin, Direct: 0.3 mg/dL — ABNORMAL HIGH (ref 0.0–0.2)
Indirect Bilirubin: 0.4 mg/dL (ref 0.3–0.9)
Total Bilirubin: 0.7 mg/dL (ref 0.0–1.2)
Total Protein: 7.5 g/dL (ref 6.5–8.1)

## 2024-10-23 LAB — D-DIMER, QUANTITATIVE: D-Dimer, Quant: 0.67 ug{FEU}/mL — ABNORMAL HIGH (ref 0.00–0.50)

## 2024-10-23 LAB — CREATININE, SERUM
Creatinine, Ser: 0.83 mg/dL (ref 0.44–1.00)
GFR, Estimated: >60 mL/min (ref >60)

## 2024-10-23 LAB — CBC
HCT: 36.6 % (ref 36.0–46.0)
HCT: 36.9 % (ref 36.0–46.0)
Hemoglobin: 12.4 g/dL (ref 12.0–15.0)
Hemoglobin: 12.8 g/dL (ref 12.0–15.0)
MCH: 31.8 pg (ref 26.0–34.0)
MCH: 32 pg (ref 26.0–34.0)
MCHC: 33.9 g/dL (ref 30.0–36.0)
MCHC: 34.7 g/dL (ref 30.0–36.0)
MCV: 93.8 fL (ref 80.0–100.0)
MCV: 92.3 fL (ref 80.0–100.0)
Platelets: 211 K/uL (ref 150–400)
Platelets: 232 K/uL (ref 150–400)
RBC: 3.9 MIL/uL (ref 3.87–5.11)
RBC: 4 MIL/uL (ref 3.87–5.11)
RDW: 14 % (ref 11.5–15.5)
RDW: 13.8 % (ref 11.5–15.5)
WBC: 5.2 K/uL (ref 4.0–10.5)
WBC: 6 K/uL (ref 4.0–10.5)
nRBC: 0 % (ref 0.0–0.2)
nRBC: 0 % (ref 0.0–0.2)

## 2024-10-23 LAB — TROPONIN T, HIGH SENSITIVITY
Troponin T High Sensitivity: 17 ng/L (ref 0–19)
Troponin T High Sensitivity: <15 ng/L (ref 0–19)

## 2024-10-23 LAB — PRO BRAIN NATRIURETIC PEPTIDE: Pro Brain Natriuretic Peptide: 5422 pg/mL — ABNORMAL HIGH (ref <300.0)

## 2024-10-23 LAB — LIPASE, BLOOD: Lipase: 72 U/L — ABNORMAL HIGH (ref 11–51)

## 2024-10-23 LAB — GLUCOSE, CAPILLARY: Glucose-Capillary: 109 mg/dL — ABNORMAL HIGH (ref 70–99)

## 2024-10-23 MED ORDER — FUROSEMIDE 10 MG/ML IJ SOLN
40.0000 mg | Freq: Once | INTRAMUSCULAR | Status: AC
  Administered 2024-10-23: 40 mg via INTRAVENOUS
  Filled 2024-10-23: qty 4

## 2024-10-23 MED ORDER — ACETAMINOPHEN 500 MG PO TABS
1000.0000 mg | ORAL_TABLET | Freq: Once | ORAL | Status: AC
  Administered 2024-10-23: 1000 mg via ORAL
  Filled 2024-10-23: qty 2

## 2024-10-23 MED ORDER — LOSARTAN POTASSIUM 25 MG PO TABS
25.0000 mg | ORAL_TABLET | Freq: Every day | ORAL | Status: DC
  Administered 2024-10-24: 25 mg via ORAL
  Filled 2024-10-23: qty 1

## 2024-10-23 MED ORDER — SODIUM CHLORIDE 0.9% FLUSH: 3.0000 mL | INTRAVENOUS | Status: DC | PRN

## 2024-10-23 MED ORDER — ALUM & MAG HYDROXIDE-SIMETH 200-200-20 MG/5ML PO SUSP: 30.0000 mL | Freq: Four times a day (QID) | ORAL | Status: DC | PRN

## 2024-10-23 MED ORDER — ONDANSETRON HCL 4 MG/2ML IJ SOLN: 4.0000 mg | Freq: Four times a day (QID) | INTRAMUSCULAR | Status: DC | PRN

## 2024-10-23 MED ORDER — LORAZEPAM 0.5 MG PO TABS: 0.5000 mg | ORAL_TABLET | Freq: Once | ORAL | Status: DC | PRN

## 2024-10-23 MED ORDER — SODIUM CHLORIDE 0.9% FLUSH
3.0000 mL | Freq: Two times a day (BID) | INTRAVENOUS | Status: DC
  Administered 2024-10-23 – 2024-10-27 (×7): 3 mL via INTRAVENOUS

## 2024-10-23 MED ORDER — ASPIRIN 81 MG PO TBEC
81.0000 mg | DELAYED_RELEASE_TABLET | Freq: Every day | ORAL | Status: DC
  Administered 2024-10-23 – 2024-10-27 (×4): 81 mg via ORAL
  Filled 2024-10-23 (×4): qty 1

## 2024-10-23 MED ORDER — PANTOPRAZOLE SODIUM 40 MG PO TBEC
40.0000 mg | DELAYED_RELEASE_TABLET | Freq: Every day | ORAL | Status: DC
  Administered 2024-10-24 – 2024-10-26 (×3): 40 mg via ORAL
  Filled 2024-10-23 (×3): qty 1

## 2024-10-23 MED ORDER — ENOXAPARIN SODIUM 40 MG/0.4ML IJ SOSY
40.0000 mg | PREFILLED_SYRINGE | INTRAMUSCULAR | Status: DC
  Filled 2024-10-23 (×3): qty 0.4

## 2024-10-23 MED ORDER — SODIUM CHLORIDE 0.9 % IV SOLN: 250.0000 mL | INTRAVENOUS | Status: AC | PRN

## 2024-10-23 MED ORDER — FUROSEMIDE 10 MG/ML IJ SOLN
40.0000 mg | Freq: Every day | INTRAMUSCULAR | Status: DC
  Administered 2024-10-23: 40 mg via INTRAVENOUS
  Filled 2024-10-23: qty 4

## 2024-10-23 MED ORDER — ACETAMINOPHEN 325 MG PO TABS
650.0000 mg | ORAL_TABLET | ORAL | Status: DC | PRN
  Administered 2024-10-26: 650 mg via ORAL
  Filled 2024-10-23: qty 2

## 2024-10-23 MED ORDER — IPRATROPIUM-ALBUTEROL 0.5-2.5 (3) MG/3ML IN SOLN: 3.0000 mL | RESPIRATORY_TRACT | Status: DC | PRN

## 2024-10-23 MED ORDER — TRAZODONE HCL 50 MG PO TABS
50.0000 mg | ORAL_TABLET | Freq: Every evening | ORAL | Status: DC | PRN
  Administered 2024-10-23: 50 mg via ORAL
  Filled 2024-10-23: qty 1

## 2024-10-23 NOTE — Plan of Care (Signed)
 Transfer from [facility] Jessica Davis is a 78 y/o with PMH of hypertension presented with complaints of shortness of breath and chest pain.  Reported complaints of PND.  Labs significant for proBNP 5422.  High-sensitivity troponins negative x 2.  Chest x-ray noted coarse interstitial disease with thickening suggestive of interstitial edema with cardiomegaly, trace bilateral pleural effusions, and aortic calcifications.  Patient had been given Lasix 40 mg IV.  Accepted to a telemetry bed for admission.  Cardiology has not been formally consulted.

## 2024-10-23 NOTE — H&P (Addendum)
 History and Physical    Patient: Jessica Davis FMW:[id number] DOB: Sep 25, 1946 DOA: 10/23/2024 DOS: the patient was seen and examined on 10/23/2024 PCP: Patient, No Pcp Per  Patient coming from: Home  Chief Complaint:  Chief Complaint  Patient presents with   Chest Pain   Shortness of Breath   HPI: Jessica Davis is a 78 y.o. female with medical history significant of HTN (not on medications-on diet/lifestyle modification)-who presented to [facility] with at least 1 week history of  intermittent chest pain and shortness of breath.  Per patient-he has noticed shortness of breath for the past 1 week or so.  She describes that shortness of breath is mostly with exertion-and at times has come on when she does her usual household chores.  She has to take a break from what she is doing and the shortness of breath gradually subsides.  She also describes having to get up from sleep multiple times at night due to shortness of breath.  She claims that she has not been able to sleep for the past 3-4 nights because she is unable to lie flat.  She also describes at least 3 episodes in the past week or so of left-sided discomfort.  When asked further-she points to her left chest/breast area at the site of discomfort.  Upon further questioning-it does not appear that this episodes were related to exertion, almost all of these episodes happen first thing in the morning.  The first 2 episodes were mild-however her most recent/third episode she describes as pain.  Pain is hard to describe for her but there was no radiation, no nausea, vomiting.  She does describe some heavy breathing when she had her third episode.  She denies any recent weight gain/leg swelling.  She subsequently presented to [facility]-where she was found to have possible pulmonary edema on chest x-ray-given IV Lasix-and subsequently referred to the hospitalist service for further evaluation and treatment.  No  fever/headache No abdominal pain No nausea, vomiting or diarrhea No hematuria/dysuria   Review of Systems: As mentioned in the history of present illness. All other systems reviewed and are negative. Past Medical History:  Diagnosis Date   Chronic back pain    Dizziness    Past Surgical History:  Procedure Laterality Date   ABDOMINAL HYSTERECTOMY     TONSILLECTOMY AND ADENOIDECTOMY     Social History:  reports that she has quit smoking. She has never used smokeless tobacco. She reports current alcohol use of about 1.0 standard drink of alcohol per week. She reports that she does not use drugs.  Allergies  Allergen Reactions   Fluticasone Propionate     Years ago. Reaction was unknown    History reviewed. No pertinent family history.  Prior to Admission medications   Medication Sig Start Date End Date Taking? Authorizing Provider  amLODipine  (NORVASC ) 5 MG tablet Take 1 tablet (5 mg total) by mouth daily. 09/22/18   Bernard Drivers, MD  Ascorbic Acid (VITAMIN C PO) Take 1 tablet by mouth daily.    [provider]  CALCIUM-VITAMIN D PO Take 1 tablet by mouth daily.    [provider]  MAGNESIUM PO Take 1 tablet by mouth daily.    [provider]  Multiple Vitamin (MULTIVITAMIN WITH MINERALS) TABS Take 1 tablet by mouth daily.    [provider]    Physical Exam: Vitals:   10/23/24 1500 10/23/24 1530 10/23/24 1545 10/23/24 1629  BP: 128/76 123/67 135/88 126/80  Pulse:  87 87 95 94  Resp: 18 19 18 16   Temp:   98.1 F (36.7 C) 98.5 F (36.9 C)  TempSrc:   Oral Oral  SpO2: 92% 93% 94% 97%  Weight:    51.2 kg  Height:    5' 2 (1.575 m)   Gen Exam:Alert awake-not in any distress HEENT:atraumatic, normocephalic Chest: Faint bibasilar rales CVS:S1S2 regular Abdomen:soft non tender, non distended Extremities:no edema Neurology: Non focal Skin: no rash  Data Reviewed:     Latest Ref Rng & Units 10/23/2024    6:04 AM 09/22/2018    12:44 PM 05/23/2013   11:50 AM  CBC  WBC 4.0 - 10.5 K/uL 5.2  4.8  5.1   Hemoglobin 12.0 - 15.0 g/dL 87.5  87.0  86.6   Hematocrit 36.0 - 46.0 % 36.6  41.7  38.7   Platelets 150 - 400 K/uL 211  254  265         Latest Ref Rng & Units 10/23/2024    6:04 AM 09/22/2018   12:44 PM 05/23/2013   11:50 AM  BMP  Glucose 70 - 99 mg/dL 897  99  95   BUN 8 - 23 mg/dL 16  10  16    Creatinine 0.44 - 1.00 mg/dL 9.33  9.26  9.29   Sodium 135 - 145 mmol/L 133  137  139   Potassium 3.5 - 5.1 mmol/L 4.2  4.5  4.0   Chloride 98 - 111 mmol/L 96  103  102   CO2 22 - 32 mmol/L 25  26  26    Calcium 8.9 - 10.3 mg/dL 9.9  9.4  9.5      Twelve-lead EKG: LBBB pattern-old   Assessment and Plan: CHF exacerbation (unspecified whether systolic/diastolic dysfunction) No peripheral edema but has basilar rales and elevated JVD IV Lasix Low-dose ARB Avoid beta-blocker until echo obtained Echocardiogram ordered May need cardiology input if EF low  HTN Managed with diet/lifestyle/exercise regimen-previously prescribed antihypertensives several years back but she never really took it. Starting low-dose losartan.  Left-sided chest pain EKG with LBBB which is old Troponins negative Currently chest pain-free Obtaining echocardiogram-based on EF/wall motion-May need further workup Does have family history of CAD, former smoker (quit 10 years back)-and reported history of HTN.  Transaminitis Mild Unclear etiology at this point ?  From hepatic congestion secondary to CHF Follow trend-if persist-then initiate further workup  Insomnia Primarily due to PND Requesting sleep aid-will try trazodone but hopeful that with further diuresis-this will not be needed long-term.   Advance Care Planning:   Code Status: Full Code   Consults: None   Family Communication: None at bedside  Severity of Illness: The appropriate patient status for this patient is INPATIENT. Inpatient status is judged to be reasonable  and necessary in order to provide the required intensity of service to ensure the patient's safety. The patient's presenting symptoms, physical exam findings, and initial radiographic and laboratory data in the context of their chronic comorbidities is felt to place them at high risk for further clinical deterioration. Furthermore, it is not anticipated that the patient will be medically stable for discharge from the hospital within 2 midnights of admission.   * I certify that at the point of admission it is my clinical judgment that the patient will require inpatient hospital care spanning beyond 2 midnights from the point of admission due to high intensity of service, high risk for further deterioration and high frequency of surveillance required.*  Author: Donalda  Yazeed Pryer, MD 10/23/2024 5:36 PM  For on call review [email or web address].

## 2024-10-23 NOTE — ED Provider Notes (Signed)
  EMERGENCY DEPARTMENT AT [facility] Provider Note   CSN: [id number] Arrival date & time: 10/23/24  9460     Patient presents with: Chest Pain and Shortness of Breath   Jessica Davis is a 78 y.o. female.   78 year old female presents ER with her chest pain shortness of breath.  Patient states has been going on for about a week.  She states that it is worse with exertion.  She has trouble sleeping because though she has been really slept well multiple nights.  No cardiac history.  She is also blood pressure medication but otherwise just home medications.  No known history of heart failure.  No recent fever but has had cough.  Nonproductive.   Chest Pain Associated symptoms: shortness of breath   Shortness of Breath Associated symptoms: chest pain        Prior to Admission medications   Medication Sig Start Date End Date Taking? Authorizing Provider  amLODipine  (NORVASC ) 5 MG tablet Take 1 tablet (5 mg total) by mouth daily. 09/22/18   Bernard Drivers, MD  Ascorbic Acid (VITAMIN C PO) Take 1 tablet by mouth daily.    [provider]  CALCIUM-VITAMIN D PO Take 1 tablet by mouth daily.    [provider]  MAGNESIUM PO Take 1 tablet by mouth daily.    [provider]  Multiple Vitamin (MULTIVITAMIN WITH MINERALS) TABS Take 1 tablet by mouth daily.    [provider]    Allergies: Fluticasone propionate    Review of Systems  Respiratory:  Positive for shortness of breath.   Cardiovascular:  Positive for chest pain.    Updated Vital Signs BP (!) 147/81 (BP Location: Left Arm)   Pulse 97   Temp 97.8 F (36.6 C) (Oral)   Resp 20   Ht 5' 2 (1.575 m)   Wt 59 kg   SpO2 100%   BMI 23.78 kg/m   Physical Exam Vitals and nursing note reviewed.  Constitutional:      Appearance: She is well-developed.  HENT:     Head: Normocephalic and atraumatic.  Cardiovascular:     Rate and Rhythm: Normal rate and regular rhythm.   Pulmonary:     Effort: Tachypnea present. No respiratory distress.     Breath sounds: No stridor. Rales present.  Abdominal:     General: There is no distension.  Musculoskeletal:     Cervical back: Normal range of motion.  Neurological:     Mental Status: She is alert.     (all labs ordered are listed, but only abnormal results are displayed) Labs Reviewed  BASIC METABOLIC PANEL WITH GFR - Abnormal; Notable for the following components:      Result Value   Sodium 133 (*)    Chloride 96 (*)    Glucose, Bld 102 (*)    All other components within normal limits  PRO BRAIN NATRIURETIC PEPTIDE - Abnormal; Notable for the following components:   Pro Brain Natriuretic Peptide 5,422.0 (*)    All other components within normal limits  HEPATIC FUNCTION PANEL - Abnormal; Notable for the following components:   AST 70 (*)    ALT 101 (*)    Bilirubin, Direct 0.3 (*)    All other components within normal limits  LIPASE, BLOOD - Abnormal; Notable for the following components:   Lipase 72 (*)    All other components within normal limits  D-DIMER, QUANTITATIVE - Abnormal; Notable for the following components:   D-Dimer,  Quant 0.67 (*)    All other components within normal limits  CBC  TROPONIN T, HIGH SENSITIVITY  TROPONIN T, HIGH SENSITIVITY    EKG: None  Radiology: DG Chest 2 View Result Date: 10/23/2024 EXAM: PA AND LATERAL (2) VIEW(S) XRAY OF THE CHEST 10/23/2024 06:31:16 AM COMPARISON: PA and lateral radiographs of the chest dated 09/22/2018 past history report. CLINICAL HISTORY: chest pain FINDINGS: LUNGS AND PLEURA: Since prior study, the patient has developed coarse interstitial disease with thickening of the interlobular septae suggesting interstitial edema. Trace bilateral pleural effusions. No pneumothorax. HEART AND MEDIASTINUM: Cardiomegaly. Aortic calcification. BONES AND SOFT TISSUES: No acute osseous abnormality. IMPRESSION: 1. Coarse interstitial disease with thickening  of the interlobular septae, suggesting interstitial edema, developed since prior study. 2. Trace bilateral pleural effusions. 3. Cardiomegaly and aortic calcification. Electronically signed by: Evalene Coho MD 10/23/2024 06:40 AM EST RP Workstation: [id number]     Procedures   Medications Ordered in the ED  furosemide (LASIX) injection 40 mg (has no administration in time range)    Clinical Course as of 10/25/24 0814  Fri Oct 23, 2024  9243 Assumed care from Dr Lorette. 78 yo F hx of HTN with no other known PMH who presents with new onset CHF. Had some chest pain several weeks ago but resolved spontaneously. Has had DOE x days. Possible PND. Dimer is within age adjusted limits. BNP elevated with interstitial edema. EKG with LBBB that has been present previously with troponin of 17.  [RP]  [phone] Patient reassessed.  She satting well on room air.  Does not appear in acute distress.  Discussed disposition and options with her and it appears that she is fairly symptomatic and lives alone so we will admit her to the hospital for first-time heart failure and echo. [RP]  1027 Discussed with Dr Claudene from hospitalist for admission to [place].  [RP]    Clinical Course User Index [RP] Yolande Lamar BROCKS, MD                                 Medical Decision Making Amount and/or Complexity of Data Reviewed Labs: ordered. Radiology: ordered.  Risk OTC drugs. Prescription drug management. Decision regarding hospitalization.   Suspect likely new onset heart failure with her symptoms.  Will add on BNP and troponin.  Chest x-ray shows small pleural effusion interstitial edema consistent with same.  Will start with IV Lasix.  She has not had any better respiratory failure right now however is at rest.  Will wait to see if she diuresis some and then ambulate her symptoms.  If she continues to be symptomatic or does not diurese much or remains tachypneic would plan for admission.  Care transferred  pending diuresis, reevaluation and disposition.   Lorette Mayo, MD 10/25/24 [phone]

## 2024-10-23 NOTE — ED Notes (Signed)
 ED TO INPATIENT HANDOFF REPORT  ED Nurse Name and Phone #: Thornell Herring, RN  S Name/Age/Gender Jessica Davis 78 y.o. female Room/Bed: DB009/DB009  Code Status   Code Status: Not on file  Home/SNF/Other Home Patient oriented to: self, place, time, and situation Is this baseline? Yes   Triage Complete: Triage complete  Chief Complaint New onset of congestive heart failure (HCC) [I50.9]  Triage Note  Patient comes in with chest pain and SOB that has been going on since 11/10.  Patient states she started having URI symptoms and was treating at home.  Yesterday she started having a heaviness in the middle of her chest and increased SOB.  Took a dose of sudafed this morning around 0300.  Tylenol around midnight.  Pain 6/10, heaviness.    Allergies Allergies  Allergen Reactions   Fluticasone Propionate     Years ago. Reaction was unknown    Level of Care/Admitting Diagnosis ED Disposition     ED Disposition  Admit   Condition  --   Comment  Hospital Area: [facility] [[id number]]  Level of Care: Telemetry [5]  May admit patient to [facility] or [facility] if equivalent level of care is available:: No  Interfacility transfer: Yes  Diagnosis: New onset of congestive heart failure [facility]) [[id number]]  Admitting Physician: CLAUDENE MAXIMINO LABOR [[id number]]  Attending Physician: CLAUDENE MAXIMINO LABOR [[id number]]  Certification:: I certify this patient will need inpatient services for at least 2 midnights  Expected Medical Readiness: 10/25/2024          B Medical/Surgery History Past Medical History:  Diagnosis Date   Chronic back pain    Dizziness    Past Surgical History:  Procedure Laterality Date   ABDOMINAL HYSTERECTOMY     TONSILLECTOMY AND ADENOIDECTOMY       A IV Location/Drains/Wounds Patient Lines/Drains/Airways Status     Active Line/Drains/Airways     Name Placement date Placement time Site Days   Peripheral IV 10/23/24 20 G 1 Left  Antecubital 10/23/24  0602  Antecubital  less than 1            Intake/Output Last 24 hours No intake or output data in the 24 hours ending 10/23/24 1351  Labs/Imaging Results for orders placed or performed during the hospital encounter of 10/23/24 (from the past 48 hours)  Basic metabolic panel     Status: Abnormal   Collection Time: 10/23/24  6:04 AM  Result Value Ref Range   Sodium 133 (L) 135 - 145 mmol/L   Potassium 4.2 3.5 - 5.1 mmol/L   Chloride 96 (L) 98 - 111 mmol/L   CO2 25 22 - 32 mmol/L   Glucose, Bld 102 (H) 70 - 99 mg/dL    Comment: Glucose reference range applies only to samples taken after fasting for at least 8 hours.   BUN 16 8 - 23 mg/dL   Creatinine, Ser 9.33 0.44 - 1.00 mg/dL   Calcium 9.9 8.9 - 89.6 mg/dL   GFR, Estimated >39 >39 mL/min    Comment: (NOTE) Calculated using the CKD-EPI Creatinine Equation (2021)    Anion gap 11 5 - 15    Comment: Performed at [facility], [address], [place], [place] [address]  CBC     Status: None   Collection Time: 10/23/24  6:04 AM  Result Value Ref Range   WBC 5.2 4.0 - 10.5 K/uL   RBC 3.90 3.87 - 5.11 MIL/uL   Hemoglobin 12.4 12.0 -  15.0 g/dL   HCT 63.3 63.9 - 53.9 %   MCV 93.8 80.0 - 100.0 fL   MCH 31.8 26.0 - 34.0 pg   MCHC 33.9 30.0 - 36.0 g/dL   RDW 85.9 88.4 - 84.4 %   Platelets 211 150 - 400 K/uL   nRBC 0.0 0.0 - 0.2 %    Comment: Performed at [facility], [address] [place], [place] [address]  Troponin T, High Sensitivity     Status: None   Collection Time: 10/23/24  6:04 AM  Result Value Ref Range   Troponin T High Sensitivity 17 0 - 19 ng/L    Comment: (NOTE) Biotin concentrations > 1000 ng/mL falsely decrease TnT results.  Serial cardiac troponin measurements are suggested.  Refer to the Links section for chest pain algorithms and additional  guidance. Performed at [facility], [address] [place], [place] [address]    Pro Brain natriuretic peptide     Status: Abnormal   Collection Time: 10/23/24  6:04 AM  Result Value Ref Range   Pro Brain Natriuretic Peptide 5,422.0 (H) <300.0 pg/mL    Comment: (NOTE) Age Group        Cut-Points    Interpretation  < 50 years     450 pg/mL       NT-proBNP > 450 pg/mL indicates                                ADHF is likely              50 to 75 years  900 pg/mL      NT-proBNP > 900 pg/mL indicates          ADHF is likely  > 75 years      1800 pg/mL     NT-proBNP > 1800 pg/mL indicates          ADHF is likely                           All ages    Results between       Indeterminate. Further clinical             300 and the cut-   information is needed to determine            point for age group   if ADHF is present.                                                             Elecsys proBNP II/ Elecsys proBNP II STAT           Cut-Point                       Interpretation  300 pg/mL                    NT-proBNP <300pg/mL indicates                             ADHF is not likely  Performed at [facility], [address], [place], [place] [address]   Hepatic  function panel     Status: Abnormal   Collection Time: 10/23/24  6:04 AM  Result Value Ref Range   Total Protein 7.5 6.5 - 8.1 g/dL   Albumin 4.4 3.5 - 5.0 g/dL   AST 70 (H) 15 - 41 U/L   ALT 101 (H) 0 - 44 U/L   Alkaline Phosphatase 72 38 - 126 U/L   Total Bilirubin 0.7 0.0 - 1.2 mg/dL   Bilirubin, Direct 0.3 (H) 0.0 - 0.2 mg/dL   Indirect Bilirubin 0.4 0.3 - 0.9 mg/dL    Comment: Performed at [facility], [address], [place], [place] [address]  Lipase, blood     Status: Abnormal   Collection Time: 10/23/24  6:04 AM  Result Value Ref Range   Lipase 72 (H) 11 - 51 U/L    Comment: Performed at [facility], [address], [place], [place] [address]  D-dimer, quantitative     Status: Abnormal   Collection Time: 10/23/24  6:04 AM   Result Value Ref Range   D-Dimer, Quant 0.67 (H) 0.00 - 0.50 ug/mL-FEU    Comment: (NOTE) At the manufacturer cut-off value of 0.5 g/mL FEU, this assay has a negative predictive value of 95-100%.This assay is intended for use in conjunction with a clinical pretest probability (PTP) assessment model to exclude pulmonary embolism (PE) and deep venous thrombosis (DVT) in outpatients suspected of PE or DVT. Results should be correlated with clinical presentation. Performed at [facility], [address], [place], [place] [address]   Troponin T, High Sensitivity     Status: None   Collection Time: 10/23/24  8:08 AM  Result Value Ref Range   Troponin T High Sensitivity <15 0 - 19 ng/L    Comment: (NOTE) Biotin concentrations > 1000 ng/mL falsely decrease TnT results.  Serial cardiac troponin measurements are suggested.  Refer to the Links section for chest pain algorithms and additional  guidance. Performed at [facility], [address], [place], [place] [address]    DG Chest 2 View Result Date: 10/23/2024 EXAM: PA AND LATERAL (2) VIEW(S) XRAY OF THE CHEST 10/23/2024 06:31:16 AM COMPARISON: PA and lateral radiographs of the chest dated 09/22/2018 past history report. CLINICAL HISTORY: chest pain FINDINGS: LUNGS AND PLEURA: Since prior study, the patient has developed coarse interstitial disease with thickening of the interlobular septae suggesting interstitial edema. Trace bilateral pleural effusions. No pneumothorax. HEART AND MEDIASTINUM: Cardiomegaly. Aortic calcification. BONES AND SOFT TISSUES: No acute osseous abnormality. IMPRESSION: 1. Coarse interstitial disease with thickening of the interlobular septae, suggesting interstitial edema, developed since prior study. 2. Trace bilateral pleural effusions. 3. Cardiomegaly and aortic calcification. Electronically signed by: Evalene Coho MD 10/23/2024 06:40 AM EST RP Workstation: [id number]     Pending Labs Unresulted Labs (From admission, onward)    None       Vitals/Pain Today's Vitals   10/23/24 1136 10/23/24 1230 10/23/24 1300 10/23/24 1330  BP:  117/74 132/83 (!) 118/95  Pulse:  83 85 93  Resp:  17 15 (!) 23  Temp:      TempSrc:      SpO2:  93% 95% 95%  Weight:      Height:      PainSc: 0-No pain       Isolation Precautions No active isolations  Medications Medications  furosemide (LASIX) injection 40 mg (40 mg Intravenous Given 10/23/24 0804)    Mobility walks     Focused Assessments    R Recommendations:  See Admitting Provider Note  Report given to:  Hospital Doctor, CHARITY FUNDRAISER  Additional Notes:

## 2024-10-23 NOTE — ED Triage Notes (Signed)
  Patient comes in with chest pain and SOB that has been going on since 11/10.  Patient states she started having URI symptoms and was treating at home.  Yesterday she started having a heaviness in the middle of her chest and increased SOB.  Took a dose of sudafed this morning around 0300.  Tylenol around midnight.  Pain 6/10, heaviness.

## 2024-10-23 NOTE — Plan of Care (Signed)
   Problem: Education: Goal: Knowledge of General Education information will improve Description: Including pain rating scale, medication(s)/side effects and non-pharmacologic comfort measures Outcome: Progressing   Problem: Clinical Measurements: Goal: Respiratory complications will improve Outcome: Progressing   Problem: Coping: Goal: Level of anxiety will decrease Outcome: Progressing

## 2024-10-23 NOTE — ED Provider Notes (Signed)
  Physical Exam  BP (!) 147/81 (BP Location: Left Arm)   Pulse 97   Temp 97.8 F (36.6 C) (Oral)   Resp 20   Ht 5' 2 (1.575 m)   Wt 59 kg   SpO2 100%   BMI 23.78 kg/m   Physical Exam  Procedures  Procedures  ED Course / MDM   Clinical Course as of 10/23/24 1645  Fri Oct 23, 2024  9243 Assumed care from Dr Lorette. 78 yo F hx of HTN with no other known PMH who presents with new onset CHF. Had some chest pain several weeks ago but resolved spontaneously. Has had DOE x days. Possible PND. Dimer is within age adjusted limits. BNP elevated with interstitial edema. EKG with LBBB that has been present previously with troponin of 17.  [RP]  [phone] Patient reassessed.  She satting well on room air.  Does not appear in acute distress.  Discussed disposition and options with her and it appears that she is fairly symptomatic and lives alone so we will admit her to the hospital for first-time heart failure and echo. [RP]  1027 Discussed with Dr Claudene from hospitalist for admission to [place].  [RP]    Clinical Course User Index [RP] Yolande Lamar BROCKS, MD   Medical Decision Making Amount and/or Complexity of Data Reviewed Labs: ordered. Radiology: ordered.  Risk OTC drugs. Prescription drug management. Decision regarding hospitalization.      Yolande Lamar BROCKS, MD 10/23/24 [phone]

## 2024-10-23 NOTE — ED Notes (Signed)
 Ok for pt to have a snack and drink per provider.

## 2024-10-23 NOTE — ED Notes (Signed)
 [facility] at bedside

## 2024-10-24 ENCOUNTER — Inpatient Hospital Stay (HOSPITAL_COMMUNITY)

## 2024-10-24 DIAGNOSIS — I509 Heart failure, unspecified: Secondary | ICD-10-CM | POA: Diagnosis not present

## 2024-10-24 DIAGNOSIS — I5021 Acute systolic (congestive) heart failure: Secondary | ICD-10-CM | POA: Diagnosis not present

## 2024-10-24 LAB — COMPREHENSIVE METABOLIC PANEL WITH GFR
ALT: 61 U/L — ABNORMAL HIGH (ref 0–44)
AST: 42 U/L — ABNORMAL HIGH (ref 15–41)
Albumin: 3.8 g/dL (ref 3.5–5.0)
Alkaline Phosphatase: 53 U/L (ref 38–126)
Anion gap: 9 (ref 5–15)
BUN: 16 mg/dL (ref 8–23)
CO2: 28 mmol/L (ref 22–32)
Calcium: 9 mg/dL (ref 8.9–10.3)
Chloride: 94 mmol/L — ABNORMAL LOW (ref 98–111)
Creatinine, Ser: 0.78 mg/dL (ref 0.44–1.00)
GFR, Estimated: >60 mL/min (ref >60)
Glucose, Bld: 106 mg/dL — ABNORMAL HIGH (ref 70–99)
Potassium: 3.7 mmol/L (ref 3.5–5.1)
Sodium: 131 mmol/L — ABNORMAL LOW (ref 135–145)
Total Bilirubin: 1.2 mg/dL (ref 0.0–1.2)
Total Protein: 6.7 g/dL (ref 6.5–8.1)

## 2024-10-24 LAB — ECHOCARDIOGRAM COMPLETE
Calc EF: 14 %
Est EF: <20
Height: 62 in
MV VTI: 1.66 cm2
S' Lateral: 3.3 cm
Single Plane A2C EF: 22.4 %
Single Plane A4C EF: 4.1 %
Weight: 1791.9 [oz_av]

## 2024-10-24 MED ORDER — FUROSEMIDE 10 MG/ML IJ SOLN
40.0000 mg | Freq: Two times a day (BID) | INTRAMUSCULAR | Status: DC
  Administered 2024-10-24 – 2024-10-25 (×3): 40 mg via INTRAVENOUS
  Filled 2024-10-24 (×3): qty 4

## 2024-10-24 MED ORDER — MELATONIN 5 MG PO TABS
5.0000 mg | ORAL_TABLET | Freq: Every evening | ORAL | Status: DC | PRN
  Administered 2024-10-24 – 2024-10-26 (×3): 5 mg via ORAL
  Filled 2024-10-24 (×3): qty 1

## 2024-10-24 MED ORDER — SPIRONOLACTONE 25 MG PO TABS
25.0000 mg | ORAL_TABLET | Freq: Every day | ORAL | Status: DC
  Administered 2024-10-24 – 2024-10-27 (×4): 25 mg via ORAL
  Filled 2024-10-24 (×4): qty 1

## 2024-10-24 MED ORDER — PERFLUTREN LIPID MICROSPHERE
1.0000 mL | INTRAVENOUS | Status: AC | PRN
  Administered 2024-10-24: 2 mL via INTRAVENOUS

## 2024-10-24 NOTE — Progress Notes (Signed)
  Echocardiogram 2D Echocardiogram has been performed.  Norleen ORN Nathanael Krist 10/24/2024, 2:15 PM

## 2024-10-24 NOTE — Progress Notes (Signed)
 PROGRESS NOTE    Jessica Davis  FMW:[id number] DOB: 10-21-1946 DOA: 10/23/2024 PCP: Patient, No Pcp Per   78/F history of hypertension, now diet controlled presented to the ED with progressive dyspnea on exertion, intermittent chest discomfort for over 1 week, along with PND and orthopnea. -Labs with BNP 5422, troponin 17, creatinine one 0.6, sodium 133, chest x-ray with interstitial edema and pleural effusions   Subjective: -Tired, has not slept in 4 days, unsure about her breathing as she has not gotten up yet  Assessment and Plan:  Acute CHF - EF unknown - Remains volume overloaded, continue IV Lasix today, losartan - Add Aldactone - Will need further workup and cards eval if EF is low  Hypertension - Improving, diuretics as above, continue losartan  Old LBBB - Troponin negative - Follow-up echo, will need ischemic workup if EF is low  Insomnia - Could not tolerate trazodone last night, try melatonin   DVT prophylaxis: Lovenox Code Status: Full code Family Communication: None present Disposition Plan: Home pending above workup     Objective: Vitals:   10/24/24 0435 10/24/24 0436 10/24/24 0530 10/24/24 0726  BP:  105/69  130/81  Pulse: 80 85  86  Resp:  17  16  Temp: 97.9 F (36.6 C) 97.6 F (36.4 C)  98.4 F (36.9 C)  TempSrc:  Oral  Oral  SpO2: 93% 96%  98%  Weight:   50.8 kg   Height:        Intake/Output Summary (Last 24 hours) at 10/24/2024 1005 Last data filed at 10/23/2024 2359 Gross per 24 hour  Intake 240 ml  Output 900 ml  Net -660 ml   Filed Weights   10/23/24 0552 10/23/24 1629 10/24/24 0530  Weight: 59 kg 51.2 kg 50.8 kg    Examination:  General exam: Appears calm and comfortable  HEENT: Positive JVD Respiratory system: Few basilar rales Cardiovascular system: S1 & S2 heard, RRR.  Abd: nondistended, soft and nontender.Normal bowel sounds heard. Central nervous system: Alert and oriented. No focal neurological  deficits. Extremities: no edema Skin: No rashes Psychiatry:  Mood & affect appropriate.     Data Reviewed:   CBC: Recent Labs  Lab 10/23/24 0604 10/23/24 1811  WBC 5.2 6.0  HGB 12.4 12.8  HCT 36.6 36.9  MCV 93.8 92.3  PLT 211 232   Basic Metabolic Panel: Recent Labs  Lab 10/23/24 0604 10/23/24 1811 10/24/24 0205  NA 133*  --  131*  K 4.2  --  3.7  CL 96*  --  94*  CO2 25  --  28  GLUCOSE 102*  --  106*  BUN 16  --  16  CREATININE 0.66 0.83 0.78  CALCIUM 9.9  --  9.0   GFR: Estimated Creatinine Clearance: 45.1 mL/min (by C-G formula based on SCr of 0.78 mg/dL). Liver Function Tests: Recent Labs  Lab 10/23/24 0604 10/24/24 0205  AST 70* 42*  ALT 101* 61*  ALKPHOS 72 53  BILITOT 0.7 1.2  PROT 7.5 6.7  ALBUMIN 4.4 3.8   Recent Labs  Lab 10/23/24 0604  LIPASE 72*   No results for input(s): AMMONIA in the last 168 hours. Coagulation Profile: No results for input(s): INR, PROTIME in the last 168 hours. Cardiac Enzymes: No results for input(s): CKTOTAL, CKMB, CKMBINDEX, TROPONINI in the last 168 hours. BNP (last 3 results) Recent Labs    10/23/24 0604  PROBNP 5,422.0*   HbA1C: No results for input(s): HGBA1C in the last 72  hours. CBG: Recent Labs  Lab 10/23/24 2306  GLUCAP 109*   Lipid Profile: No results for input(s): CHOL, HDL, LDLCALC, TRIG, CHOLHDL, LDLDIRECT in the last 72 hours. Thyroid Function Tests: No results for input(s): TSH, T4TOTAL, FREET4, T3FREE, THYROIDAB in the last 72 hours. Anemia Panel: No results for input(s): VITAMINB12, FOLATE, FERRITIN, TIBC, IRON, RETICCTPCT in the last 72 hours. Urine analysis: No results found for: COLORURINE, APPEARANCEUR, LABSPEC, PHURINE, GLUCOSEU, HGBUR, BILIRUBINUR, KETONESUR, PROTEINUR, UROBILINOGEN, NITRITE, LEUKOCYTESUR Sepsis Labs: @LABRCNTIP (procalcitonin:4,lacticidven:4)  )No results found for this or any previous  visit (from the past 240 hours).   Radiology Studies: DG Chest 2 View Result Date: 10/23/2024 EXAM: PA AND LATERAL (2) VIEW(S) XRAY OF THE CHEST 10/23/2024 06:31:16 AM COMPARISON: PA and lateral radiographs of the chest dated 09/22/2018 past history report. CLINICAL HISTORY: chest pain FINDINGS: LUNGS AND PLEURA: Since prior study, the patient has developed coarse interstitial disease with thickening of the interlobular septae suggesting interstitial edema. Trace bilateral pleural effusions. No pneumothorax. HEART AND MEDIASTINUM: Cardiomegaly. Aortic calcification. BONES AND SOFT TISSUES: No acute osseous abnormality. IMPRESSION: 1. Coarse interstitial disease with thickening of the interlobular septae, suggesting interstitial edema, developed since prior study. 2. Trace bilateral pleural effusions. 3. Cardiomegaly and aortic calcification. Electronically signed by: Evalene Coho MD 10/23/2024 06:40 AM EST RP Workstation: [id number]     Scheduled Meds:  aspirin EC  81 mg Oral Daily   enoxaparin (LOVENOX) injection  40 mg Subcutaneous Q24H   furosemide  40 mg Intravenous BID   losartan  25 mg Oral Daily   pantoprazole  40 mg Oral Q1200   sodium chloride flush  3 mL Intravenous Q12H   spironolactone  25 mg Oral Daily   Continuous Infusions:  sodium chloride       LOS: 1 day    Time spent:    Sigurd Pac, MD [facility]   10/24/2024, 10:05 AM

## 2024-10-25 DIAGNOSIS — I5041 Acute combined systolic (congestive) and diastolic (congestive) heart failure: Secondary | ICD-10-CM | POA: Diagnosis not present

## 2024-10-25 DIAGNOSIS — E669 Obesity, unspecified: Secondary | ICD-10-CM | POA: Insufficient documentation

## 2024-10-25 DIAGNOSIS — I509 Heart failure, unspecified: Secondary | ICD-10-CM | POA: Diagnosis not present

## 2024-10-25 DIAGNOSIS — I447 Left bundle-branch block, unspecified: Secondary | ICD-10-CM | POA: Diagnosis not present

## 2024-10-25 DIAGNOSIS — I10 Essential (primary) hypertension: Secondary | ICD-10-CM | POA: Diagnosis not present

## 2024-10-25 LAB — BASIC METABOLIC PANEL WITH GFR
Anion gap: 12 (ref 5–15)
BUN: 21 mg/dL (ref 8–23)
CO2: 26 mmol/L (ref 22–32)
Calcium: 8.9 mg/dL (ref 8.9–10.3)
Chloride: 94 mmol/L — ABNORMAL LOW (ref 98–111)
Creatinine, Ser: 0.75 mg/dL (ref 0.44–1.00)
GFR, Estimated: >60 mL/min (ref >60)
Glucose, Bld: 91 mg/dL (ref 70–99)
Potassium: 3.3 mmol/L — ABNORMAL LOW (ref 3.5–5.1)
Sodium: 132 mmol/L — ABNORMAL LOW (ref 135–145)

## 2024-10-25 LAB — LACTIC ACID, PLASMA
Lactic Acid, Venous: 1.1 mmol/L (ref 0.5–1.9)
Lactic Acid, Venous: 1.3 mmol/L (ref 0.5–1.9)

## 2024-10-25 MED ORDER — POTASSIUM CHLORIDE CRYS ER 20 MEQ PO TBCR
40.0000 meq | EXTENDED_RELEASE_TABLET | ORAL | Status: AC
Start: 2024-10-25 — End: 2024-10-25
  Administered 2024-10-25 (×2): 40 meq via ORAL
  Filled 2024-10-25 (×2): qty 2

## 2024-10-25 MED ORDER — ASPIRIN 81 MG PO CHEW
81.0000 mg | CHEWABLE_TABLET | ORAL | Status: AC
  Administered 2024-10-26: 81 mg via ORAL
  Filled 2024-10-25: qty 1

## 2024-10-25 MED ORDER — FREE WATER
250.0000 mL | Freq: Once | Status: AC
  Administered 2024-10-26: 250 mL via ORAL

## 2024-10-25 NOTE — H&P (View-Only) (Signed)
 Cardiology Consultation   Patient ID: Jessica Davis MRN: [id number]; DOB: October 16, 1946  Admit date: 10/23/2024 Date of Consult: 10/25/2024  PCP:  Patient, No Pcp Per    HeartCare Providers Cardiologist:  None   Patient Profile: Jessica Davis is a 78 y.o. female with a hx of hypertension, former smoker who is being seen 10/25/2024 for the evaluation of new onset HFrEF at the request of Jessica Davis.  History of Present Illness: Jessica Davis has no prior cardiac history.  She reports family history of cardiac disease but she is unclear of what this exactly is.  Denied family history of familial MI.  Former smoker of 30+ years but quit about 12 years ago.  Also quit alcohol around the same time but was never a heavy user.  No other significant medical history.  Currently patient being evaluated for acute, new onset HFrEF with an EF less than 20% with severe dilation and global hypokinesis.  Normal RV.  Chest x-ray with vascular congestion.  proBNP 5400+.  Negative troponins.  She has been diuresing on IV Lasix 40 mg twice daily.  Patient is self-sufficient and lives independently at home.  She has a brother who she is in contact with but also has a son that she does not speak to.  Generally she is self-sufficient, moderately active going to exercise classes and aerobics.  She reports in late October and November she was having intermittent complaints of vague chest pain without any associated symptoms of nausea, vomiting, radiating pain, diaphoresis.  Reported she thought that symptoms felt like indigestion and never seen for these complaints.  Within the last 1 week she started noticing PND, orthopnea and increasing DOE while doing household chores.  Denied any significant peripheral edema.  Also reports sleeping poorly.  [facility] and [facility] for [facility] evaluation.  She feels overall back to baseline right now, started about  her new diagnosis and emotional.  Denies any acute complaints right now and feeling well otherwise.  Potassium 3.3.  Creatinine 0.75.  Mildly elevated liver enzymes.  Hemoglobin 12.8.  Past Medical History:  Diagnosis Date   Chronic back pain    Dizziness     Past Surgical History:  Procedure Laterality Date   ABDOMINAL HYSTERECTOMY     TONSILLECTOMY AND ADENOIDECTOMY       Scheduled Meds:  aspirin EC  81 mg Oral Daily   enoxaparin (LOVENOX) injection  40 mg Subcutaneous Q24H   pantoprazole  40 mg Oral Q1200   potassium chloride  40 mEq Oral Q2H   sodium chloride flush  3 mL Intravenous Q12H   spironolactone  25 mg Oral Daily   Continuous Infusions:  PRN Meds: acetaminophen, alum & mag hydroxide-simeth, ipratropium-albuterol, LORazepam, melatonin, ondansetron (ZOFRAN) IV, sodium chloride flush  Allergies:    Allergies  Allergen Reactions   Fluticasone Propionate     Years ago. Reaction was unknown    Social History:   Social History   Socioeconomic History   Marital status: Legally Separated    Spouse name: Not on file   Number of children: 1   Years of education: BA   Highest education level: Not on file  Occupational History    Comment: Retired  Tobacco Use   Smoking status: Former   Smokeless tobacco: Never  Substance and Sexual Activity   Alcohol use: Yes    Alcohol/week: 1.0 standard drink of alcohol    Types: 1 drink(s)  per week    Comment: OCC  once a year   Drug use: No   Sexual activity: Not on file  Other Topics Concern   Not on file  Social History Narrative   Patient is retired and lives at home alone college education. Patient has one child. Patient right handed.   Caffeine -one daily.   Social Drivers of Corporate Investment Banker Strain: Not on file  Food Insecurity: No Food Insecurity (10/23/2024)   Hunger Vital Sign    Worried About Running Out of Food in the Last Year: Never true    Ran Out of Food in the Last Year: Never true   Transportation Needs: Unmet Transportation Needs (10/23/2024)   PRAPARE - Administrator, Civil Service (Medical): Yes    Lack of Transportation (Non-Medical): No  Physical Activity: Not on file  Stress: Not on file  Social Connections: Patient Declined (10/23/2024)   Social Connection and Isolation Panel    Frequency of Communication with Friends and Family: Patient declined    Frequency of Social Gatherings with Friends and Family: Patient declined    Attends Religious Services: Patient declined    Database Administrator or Organizations: Patient declined    Attends Banker Meetings: Patient declined    Marital Status: Patient declined  Intimate Partner Violence: Patient Declined (10/23/2024)   Humiliation, Afraid, Rape, and Kick questionnaire    Fear of Current or Ex-Partner: Patient declined    Emotionally Abused: Patient declined    Physically Abused: Patient declined    Sexually Abused: Patient declined    Family History:   History reviewed. No pertinent family history.   ROS:  Please see the history of present illness.  All other ROS reviewed and negative.     Physical Exam/Data: Vitals:   10/24/24 2342 10/25/24 0340 10/25/24 0546 10/25/24 0718  BP:  101/66  114/81  Pulse:  85  76  Resp: 16 16  18   Temp: (!) 97.5 F (36.4 C) 98.2 F (36.8 C)  98.2 F (36.8 C)  TempSrc:  Oral  Oral  SpO2:  96%  92%  Weight:   50.6 kg   Height:        Intake/Output Summary (Last 24 hours) at 10/25/2024 0910 Last data filed at 10/25/2024 0600 Gross per 24 hour  Intake 150 ml  Output --  Net 150 ml      10/25/2024    5:46 AM 10/24/2024    5:30 AM 10/23/2024    4:29 PM  Last 3 Weights  Weight (lbs) 111 lb 8.8 oz 111 lb 15.9 oz 112 lb 14 oz  Weight (kg) 50.6 kg 50.8 kg 51.2 kg     Body mass index is 20.4 kg/m.  General:  Well nourished, well developed, in no acute distress HEENT: normal Neck: mild JVD Vascular: No carotid bruits; Distal  pulses 2+ bilaterally Cardiac:  normal S1, S2; RRR; no murmur  Lungs:  clear to auscultation bilaterally, no wheezing, rhonchi or rales  Abd: soft, nontender, no hepatomegaly  Ext: no edema Musculoskeletal:  No deformities, BUE and BLE strength normal and equal Skin: warm and dry  Neuro:  CNs 2-12 intact, no focal abnormalities noted Psych:  Normal affect   EKG:  The EKG was personally reviewed and demonstrates: Sinus rhythm.  Heart rate 77.  Left bundle branch block. Telemetry:  Telemetry was personally reviewed and demonstrates: Sinus rhythm  Relevant CV Studies: Echocardiogram 10/24/2024 1. Left ventricular ejection  fraction, by estimation, is <20%. The left  ventricle has severely decreased function. The left ventricle demonstrates  global hypokinesis. The left ventricular internal cavity size was severely  dilated. Indeterminate  diastolic filling due to E-A fusion. The average left ventricular global  longitudinal strain is -6.9 %. The global longitudinal strain is abnormal.   2. Right ventricular systolic function is normal. The right ventricular  size is normal. There is normal pulmonary artery systolic pressure. The  estimated right ventricular systolic pressure is 27.2 mmHg.   3. Left atrial size was mildly dilated.   4. The mitral valve is normal in structure. Mild to moderate mitral valve  regurgitation. No evidence of mitral stenosis.   5. The aortic valve is tricuspid. Aortic valve regurgitation is not  visualized. No aortic stenosis is present.   6. The inferior vena cava is normal in size with greater than 50%  respiratory variability, suggesting right atrial pressure of 3 mmHg.    Laboratory Data: High Sensitivity Troponin:  No results for input(s): TROPONINIHS in the last 720 hours.   Chemistry Recent Labs  Lab 10/23/24 0604 10/23/24 1811 10/24/24 0205 10/25/24 0207  NA 133*  --  131* 132*  K 4.2  --  3.7 3.3*  CL 96*  --  94* 94*  CO2 25  --  28 26   GLUCOSE 102*  --  106* 91  BUN 16  --  16 21  CREATININE 0.66 0.83 0.78 0.75  CALCIUM 9.9  --  9.0 8.9  GFRNONAA >60 >60 >60 >60  ANIONGAP 11  --  9 12    Recent Labs  Lab 10/23/24 0604 10/24/24 0205  PROT 7.5 6.7  ALBUMIN 4.4 3.8  AST 70* 42*  ALT 101* 61*  ALKPHOS 72 53  BILITOT 0.7 1.2   Lipids No results for input(s): CHOL, TRIG, HDL, LABVLDL, LDLCALC, CHOLHDL in the last 168 hours.  Hematology Recent Labs  Lab 10/23/24 0604 10/23/24 1811  WBC 5.2 6.0  RBC 3.90 4.00  HGB 12.4 12.8  HCT 36.6 36.9  MCV 93.8 92.3  MCH 31.8 32.0  MCHC 33.9 34.7  RDW 14.0 13.8  PLT 211 232   Thyroid No results for input(s): TSH, FREET4 in the last 168 hours.  BNP Recent Labs  Lab 10/23/24 0604  PROBNP 5,422.0*    DDimer  Recent Labs  Lab 10/23/24 0604  DDIMER 0.67*    Radiology/Studies:  ECHOCARDIOGRAM COMPLETE Result Date: 10/24/2024    ECHOCARDIOGRAM REPORT   Patient Name:   TRENEE IGOE Date of Exam: 10/24/2024 Medical Rec #:  [id number]     Height:       62.0 in Accession #:    [id number]    Weight:       112.0 lb Date of Birth:  [birth year]     BSA:          1.494 m Patient Age:    78 years      BP:           130/81 mmHg Patient Gender: F             HR:           80 bpm. Exam Location:  Inpatient Procedure: 2D Echo and Intracardiac Opacification Agent (Both Spectral and Color            Flow Doppler were utilized during procedure). Indications:    CHF  History:        Patient has no  prior history of Echocardiogram examinations.                 CHF.  Sonographer:    Norleen Amour Referring Phys: DONALDA HERO GHIMIRE IMPRESSIONS  1. Left ventricular ejection fraction, by estimation, is <20%. The left ventricle has severely decreased function. The left ventricle demonstrates global hypokinesis. The left ventricular internal cavity size was severely dilated. Indeterminate diastolic filling due to E-A fusion. The average left ventricular global longitudinal strain is  -6.9 %. The global longitudinal strain is abnormal.  2. Right ventricular systolic function is normal. The right ventricular size is normal. There is normal pulmonary artery systolic pressure. The estimated right ventricular systolic pressure is 27.2 mmHg.  3. Left atrial size was mildly dilated.  4. The mitral valve is normal in structure. Mild to moderate mitral valve regurgitation. No evidence of mitral stenosis.  5. The aortic valve is tricuspid. Aortic valve regurgitation is not visualized. No aortic stenosis is present.  6. The inferior vena cava is normal in size with greater than 50% respiratory variability, suggesting right atrial pressure of 3 mmHg. FINDINGS  Left Ventricle: Left ventricular ejection fraction, by estimation, is <20%. The left ventricle has severely decreased function. The left ventricle demonstrates global hypokinesis. The average left ventricular global longitudinal strain is -6.9 %. Strain  was performed and the global longitudinal strain is abnormal. The left ventricular internal cavity size was severely dilated. There is no left ventricular hypertrophy. Indeterminate diastolic filling due to E-A fusion. Right Ventricle: The right ventricular size is normal. No increase in right ventricular wall thickness. Right ventricular systolic function is normal. There is normal pulmonary artery systolic pressure. The tricuspid regurgitant velocity is 2.46 m/s, and  with an assumed right atrial pressure of 3 mmHg, the estimated right ventricular systolic pressure is 27.2 mmHg. Left Atrium: Left atrial size was mildly dilated. Right Atrium: Right atrial size was normal in size. Pericardium: There is no evidence of pericardial effusion. Mitral Valve: The mitral valve is normal in structure. Mild to moderate mitral valve regurgitation. No evidence of mitral valve stenosis. MV peak gradient, 4.2 mmHg. The mean mitral valve gradient is 2.0 mmHg. Tricuspid Valve: The tricuspid valve is normal in  structure. Tricuspid valve regurgitation is trivial. Aortic Valve: The aortic valve is tricuspid. Aortic valve regurgitation is not visualized. No aortic stenosis is present. Pulmonic Valve: The pulmonic valve was not well visualized. Pulmonic valve regurgitation is not visualized. Aorta: The aortic root and ascending aorta are structurally normal, with no evidence of dilitation. Venous: The inferior vena cava is normal in size with greater than 50% respiratory variability, suggesting right atrial pressure of 3 mmHg. IAS/Shunts: The interatrial septum was not well visualized.  LEFT VENTRICLE PLAX 2D LVIDd:         5.00 cm      Diastology LVIDs:         3.30 cm      LV e' medial:  5.11 cm/s LV PW:         0.90 cm      LV e' lateral: 20.70 cm/s LV IVS:        0.70 cm LVOT diam:     1.80 cm      2D Longitudinal Strain LV SV:         30           2D Strain GLS Avg:     -6.9 % LV SV Index:   20 LVOT Area:  2.54 cm  LV Volumes (MOD) LV vol d, MOD A2C: 214.0 ml LV vol d, MOD A4C: 196.0 ml LV vol s, MOD A2C: 166.0 ml LV vol s, MOD A4C: 188.0 ml LV SV MOD A2C:     48.0 ml LV SV MOD A4C:     196.0 ml LV SV MOD BP:      29.0 ml RIGHT VENTRICLE             IVC RV Basal diam:  2.60 cm     IVC diam: 1.20 cm RV S prime:     13.80 cm/s TAPSE (M-mode): 2.2 cm      PULMONARY VEINS                             Diastolic Velocity: 29.80 cm/s                             S/D Velocity:       1.20                             Systolic Velocity:  34.60 cm/s LEFT ATRIUM             Index        RIGHT ATRIUM           Index LA diam:        4.10 cm 2.74 cm/m   RA Area:     11.00 cm LA Vol (A2C):   65.4 ml 43.77 ml/m  RA Volume:   21.20 ml  14.19 ml/m LA Vol (A4C):   51.2 ml 34.27 ml/m LA Biplane Vol: 58.8 ml 39.35 ml/m  AORTIC VALVE LVOT Vmax:   67.20 cm/s LVOT Vmean:  46.000 cm/s LVOT VTI:    0.116 m  AORTA Ao Root diam: 3.40 cm Ao Asc diam:  3.20 cm MITRAL VALVE             TRICUSPID VALVE MV Area VTI:  1.66 cm   TR Peak grad:    24.2 mmHg MV Peak grad: 4.2 mmHg   TR Vmax:        246.00 cm/s MV Mean grad: 2.0 mmHg MV Vmax:      1.03 m/s   SHUNTS MV Vmean:     67.0 cm/s  Systemic VTI:  0.12 m                          Systemic Diam: 1.80 cm Lonni Nanas MD Electronically signed by Lonni Nanas MD Signature Date/Time: 10/24/2024/4:07:26 PM    Final    DG Chest 2 View Result Date: 10/23/2024 EXAM: PA AND LATERAL (2) VIEW(S) XRAY OF THE CHEST 10/23/2024 06:31:16 AM COMPARISON: PA and lateral radiographs of the chest dated 09/22/2018 past history report. CLINICAL HISTORY: chest pain FINDINGS: LUNGS AND PLEURA: Since prior study, the patient has developed coarse interstitial disease with thickening of the interlobular septae suggesting interstitial edema. Trace bilateral pleural effusions. No pneumothorax. HEART AND MEDIASTINUM: Cardiomegaly. Aortic calcification. BONES AND SOFT TISSUES: No acute osseous abnormality. IMPRESSION: 1. Coarse interstitial disease with thickening of the interlobular septae, suggesting interstitial edema, developed since prior study. 2. Trace bilateral pleural effusions. 3. Cardiomegaly and aortic calcification. Electronically signed by: Evalene Coho MD 10/23/2024 06:40 AM EST RP Workstation: [id number]     Assessment and  Plan:  New onset HFrEF LBBB Patient with no prior cardiac history.  Echocardiogram this admission with EF less than 20% with severe dilation.  Normal RV mild to moderate MR.  RA pressure 3.  Etiology of this possibly could be related to ischemia as she has been reporting intermittent chest pains prior to admission and did not seek evaluation.  May also be related to chronic left bundle branch block that has been documented on EKG since at least 2014.  Fortunately she appears to be well compensated, euvolemic and warm on exam. Will plan for right and left heart catheterization tomorrow.  If unremarkable will likely need cardiac MRI. GDMT: She has been started on  spironolactone 25 mg.  Will titrate further GDMT after cardiac catheterization.  Avoid beta-blockers for now until we get better understanding of her cardiac output. Does not appear in shock, check lactate Stopping IV diuretics for now, appears euvolemic.  Continue to replete potassium as needed.  Informed Consent   Shared Decision Making/Informed Consent{  The risks [stroke (1 in 1000), death (1 in 1000), kidney failure [usually temporary] (1 in 500), bleeding (1 in 200), allergic reaction [possibly serious] (1 in 200)], benefits (diagnostic support and management of coronary artery disease) and alternatives of a cardiac catheterization were discussed in detail with Ms. Tiggs and she is willing to proceed.      Elevated LFTs Mild elevation.  May be related to hepatic congestion and downtrending after diuresis.   Risk Assessment/Risk Scores:     New [place]  Heart Association (NYHA) Functional Class NYHA Class II       For questions or updates, please contact [facility] Please consult [email or web address] for contact info under    Signed, Thom LITTIE Sluder, PA-C  10/25/2024 9:10 AM    Patient seen and examined.  Agree with above documentation.  Ms. Shumard is a 78 year old female with a history of hypertension, prior tobacco use who were consulted by Jessica Davis for evaluation of acute combined heart failure.  She has no known cardiac history.  She presented with worsening shortness of breath.  Initial vital signs notable for BP 105/69, pulse ox 96% on room air, pulse 81.  Labs notable for creatinine 0.7, sodium 133, potassium 4.2, proBNP 5422, troponin 17 > 15, WBC 5.2, hemoglobin 12.4.  Chest x-ray showed interstitial edema.  EKG showed left bundle branch block, heart sinus rhythm, rate 91.  Echocardiogram showed EF less than 20%, severe LV dilatation, normal RV function, no significant valvular disease, RAP 3.  On exam, patient is alert and oriented, regular rate and rhythm, no murmurs,  lungs CTAB, no LE edema or JVD.  For her acute combined heart failure, unclear etiology.  Will need to rule out ischemic cardiomyopathy.  Plan LHC/RHC tomorrow.  She has been diuresed with IV Lasix, currently appears euvolemic.  Received IV Lasix this morning, will hold off further diuresis and follow-up results of RHC tomorrow.  She is on spironolactone, given soft BP will hold off on adding additional GDMT until after cath.  If cath unremarkable, will need CMR.  Lonni LITTIE Nanas, MD

## 2024-10-25 NOTE — Progress Notes (Signed)
 [facility] Progress Note Patient: Jessica Davis FMW:[id number] DOB: 1946-07-01  DOA: 10/23/2024 DOS: the patient was seen and examined on 10/25/2024  Brief Hospital Course: 78/F history of hypertension, now diet controlled presented to the ED with progressive dyspnea on exertion, intermittent chest discomfort for over 1 week, along with PND and orthopnea. -Labs with BNP 5422, troponin 17, creatinine one 0.6, sodium 133, chest x-ray with interstitial edema and pleural effusions  Assessment and Plan: Acute systolic CHF Echocardiogram shows EF less than 20%. Cardiology consulted. Volume status appears to be improving. Lasix on hold. Plan for cardiac catheterization tomorrow. Further workup per cardiology.   Hypertension Blood pressure improving, diuretics as above, continue losartan   Old LBBB Troponin negative Ischemic workup tomorrow.   Insomnia Continue melatonin.  Subjective: Denies any chest pain.  No shortness of breath.  No nausea no vomiting.  Physical Exam: Clear to auscultation. S1-S2 present Bowel sound present No edema.  Data Reviewed: I have Reviewed nursing notes, Vitals, and Lab results. Since last encounter, pertinent lab results CBC and BMP   . I have ordered test including CBC and BMP  .   Disposition: Status is: Inpatient Remains inpatient appropriate because: Monitor for further workup  enoxaparin (LOVENOX) injection 40 mg Start: 10/23/24 1830   Family Communication: No one at bedside Level of care: Telemetry   Vitals:   10/25/24 0546 10/25/24 0718 10/25/24 1127 10/25/24 1700  BP:  114/81 116/77 111/71  Pulse:  76  79  Resp:  18 20 20   Temp:  98.2 F (36.8 C) 98.2 F (36.8 C) 98 F (36.7 C)  TempSrc:  Oral Oral Oral  SpO2:  92%  95%  Weight: 50.6 kg     Height:         Author: Yetta Blanch, MD 10/25/2024 6:21 PM  Please look on [email or web address] to find out who is on call.

## 2024-10-25 NOTE — Consult Note (Addendum)
 Cardiology Consultation   Patient ID: Jessica Davis MRN: [id number]; DOB: October 16, 1946  Admit date: 10/23/2024 Date of Consult: 10/25/2024  PCP:  Patient, No Pcp Per    HeartCare Providers Cardiologist:  None   Patient Profile: Jessica Davis is a 78 y.o. female with a hx of hypertension, former smoker who is being seen 10/25/2024 for the evaluation of new onset HFrEF at the request of Dr. Tobie.  History of Present Illness: Jessica Davis has no prior cardiac history.  She reports family history of cardiac disease but she is unclear of what this exactly is.  Denied family history of familial MI.  Former smoker of 30+ years but quit about 12 years ago.  Also quit alcohol around the same time but was never a heavy user.  No other significant medical history.  Currently patient being evaluated for acute, new onset HFrEF with an EF less than 20% with severe dilation and global hypokinesis.  Normal RV.  Chest x-ray with vascular congestion.  proBNP 5400+.  Negative troponins.  She has been diuresing on IV Lasix 40 mg twice daily.  Patient is self-sufficient and lives independently at home.  She has a brother who she is in contact with but also has a son that she does not speak to.  Generally she is self-sufficient, moderately active going to exercise classes and aerobics.  She reports in late October and November she was having intermittent complaints of vague chest pain without any associated symptoms of nausea, vomiting, radiating pain, diaphoresis.  Reported she thought that symptoms felt like indigestion and never seen for these complaints.  Within the last 1 week she started noticing PND, orthopnea and increasing DOE while doing household chores.  Denied any significant peripheral edema.  Also reports sleeping poorly.  [facility] and [facility] for [facility] evaluation.  She feels overall back to baseline right now, started about  her new diagnosis and emotional.  Denies any acute complaints right now and feeling well otherwise.  Potassium 3.3.  Creatinine 0.75.  Mildly elevated liver enzymes.  Hemoglobin 12.8.  Past Medical History:  Diagnosis Date   Chronic back pain    Dizziness     Past Surgical History:  Procedure Laterality Date   ABDOMINAL HYSTERECTOMY     TONSILLECTOMY AND ADENOIDECTOMY       Scheduled Meds:  aspirin EC  81 mg Oral Daily   enoxaparin (LOVENOX) injection  40 mg Subcutaneous Q24H   pantoprazole  40 mg Oral Q1200   potassium chloride  40 mEq Oral Q2H   sodium chloride flush  3 mL Intravenous Q12H   spironolactone  25 mg Oral Daily   Continuous Infusions:  PRN Meds: acetaminophen, alum & mag hydroxide-simeth, ipratropium-albuterol, LORazepam, melatonin, ondansetron (ZOFRAN) IV, sodium chloride flush  Allergies:    Allergies  Allergen Reactions   Fluticasone Propionate     Years ago. Reaction was unknown    Social History:   Social History   Socioeconomic History   Marital status: Legally Separated    Spouse name: Not on file   Number of children: 1   Years of education: BA   Highest education level: Not on file  Occupational History    Comment: Retired  Tobacco Use   Smoking status: Former   Smokeless tobacco: Never  Substance and Sexual Activity   Alcohol use: Yes    Alcohol/week: 1.0 standard drink of alcohol    Types: 1 drink(s)  per week    Comment: OCC  once a year   Drug use: No   Sexual activity: Not on file  Other Topics Concern   Not on file  Social History Narrative   Patient is retired and lives at home alone college education. Patient has one child. Patient right handed.   Caffeine -one daily.   Social Drivers of Corporate Investment Banker Strain: Not on file  Food Insecurity: No Food Insecurity (10/23/2024)   Hunger Vital Sign    Worried About Running Out of Food in the Last Year: Never true    Ran Out of Food in the Last Year: Never true   Transportation Needs: Unmet Transportation Needs (10/23/2024)   PRAPARE - Administrator, Civil Service (Medical): Yes    Lack of Transportation (Non-Medical): No  Physical Activity: Not on file  Stress: Not on file  Social Connections: Patient Declined (10/23/2024)   Social Connection and Isolation Panel    Frequency of Communication with Friends and Family: Patient declined    Frequency of Social Gatherings with Friends and Family: Patient declined    Attends Religious Services: Patient declined    Database Administrator or Organizations: Patient declined    Attends Banker Meetings: Patient declined    Marital Status: Patient declined  Intimate Partner Violence: Patient Declined (10/23/2024)   Humiliation, Afraid, Rape, and Kick questionnaire    Fear of Current or Ex-Partner: Patient declined    Emotionally Abused: Patient declined    Physically Abused: Patient declined    Sexually Abused: Patient declined    Family History:   History reviewed. No pertinent family history.   ROS:  Please see the history of present illness.  All other ROS reviewed and negative.     Physical Exam/Data: Vitals:   10/24/24 2342 10/25/24 0340 10/25/24 0546 10/25/24 0718  BP:  101/66  114/81  Pulse:  85  76  Resp: 16 16  18   Temp: (!) 97.5 F (36.4 C) 98.2 F (36.8 C)  98.2 F (36.8 C)  TempSrc:  Oral  Oral  SpO2:  96%  92%  Weight:   50.6 kg   Height:        Intake/Output Summary (Last 24 hours) at 10/25/2024 0910 Last data filed at 10/25/2024 0600 Gross per 24 hour  Intake 150 ml  Output --  Net 150 ml      10/25/2024    5:46 AM 10/24/2024    5:30 AM 10/23/2024    4:29 PM  Last 3 Weights  Weight (lbs) 111 lb 8.8 oz 111 lb 15.9 oz 112 lb 14 oz  Weight (kg) 50.6 kg 50.8 kg 51.2 kg     Body mass index is 20.4 kg/m.  General:  Well nourished, well developed, in no acute distress HEENT: normal Neck: mild JVD Vascular: No carotid bruits; Distal  pulses 2+ bilaterally Cardiac:  normal S1, S2; RRR; no murmur  Lungs:  clear to auscultation bilaterally, no wheezing, rhonchi or rales  Abd: soft, nontender, no hepatomegaly  Ext: no edema Musculoskeletal:  No deformities, BUE and BLE strength normal and equal Skin: warm and dry  Neuro:  CNs 2-12 intact, no focal abnormalities noted Psych:  Normal affect   EKG:  The EKG was personally reviewed and demonstrates: Sinus rhythm.  Heart rate 77.  Left bundle branch block. Telemetry:  Telemetry was personally reviewed and demonstrates: Sinus rhythm  Relevant CV Studies: Echocardiogram 10/24/2024 1. Left ventricular ejection  fraction, by estimation, is <20%. The left  ventricle has severely decreased function. The left ventricle demonstrates  global hypokinesis. The left ventricular internal cavity size was severely  dilated. Indeterminate  diastolic filling due to E-A fusion. The average left ventricular global  longitudinal strain is -6.9 %. The global longitudinal strain is abnormal.   2. Right ventricular systolic function is normal. The right ventricular  size is normal. There is normal pulmonary artery systolic pressure. The  estimated right ventricular systolic pressure is 27.2 mmHg.   3. Left atrial size was mildly dilated.   4. The mitral valve is normal in structure. Mild to moderate mitral valve  regurgitation. No evidence of mitral stenosis.   5. The aortic valve is tricuspid. Aortic valve regurgitation is not  visualized. No aortic stenosis is present.   6. The inferior vena cava is normal in size with greater than 50%  respiratory variability, suggesting right atrial pressure of 3 mmHg.    Laboratory Data: High Sensitivity Troponin:  No results for input(s): TROPONINIHS in the last 720 hours.   Chemistry Recent Labs  Lab 10/23/24 0604 10/23/24 1811 10/24/24 0205 10/25/24 0207  NA 133*  --  131* 132*  K 4.2  --  3.7 3.3*  CL 96*  --  94* 94*  CO2 25  --  28 26   GLUCOSE 102*  --  106* 91  BUN 16  --  16 21  CREATININE 0.66 0.83 0.78 0.75  CALCIUM 9.9  --  9.0 8.9  GFRNONAA >60 >60 >60 >60  ANIONGAP 11  --  9 12    Recent Labs  Lab 10/23/24 0604 10/24/24 0205  PROT 7.5 6.7  ALBUMIN 4.4 3.8  AST 70* 42*  ALT 101* 61*  ALKPHOS 72 53  BILITOT 0.7 1.2   Lipids No results for input(s): CHOL, TRIG, HDL, LABVLDL, LDLCALC, CHOLHDL in the last 168 hours.  Hematology Recent Labs  Lab 10/23/24 0604 10/23/24 1811  WBC 5.2 6.0  RBC 3.90 4.00  HGB 12.4 12.8  HCT 36.6 36.9  MCV 93.8 92.3  MCH 31.8 32.0  MCHC 33.9 34.7  RDW 14.0 13.8  PLT 211 232   Thyroid No results for input(s): TSH, FREET4 in the last 168 hours.  BNP Recent Labs  Lab 10/23/24 0604  PROBNP 5,422.0*    DDimer  Recent Labs  Lab 10/23/24 0604  DDIMER 0.67*    Radiology/Studies:  ECHOCARDIOGRAM COMPLETE Result Date: 10/24/2024    ECHOCARDIOGRAM REPORT   Patient Name:   TRENEE IGOE Date of Exam: 10/24/2024 Medical Rec #:  [id number]     Height:       62.0 in Accession #:    [id number]    Weight:       112.0 lb Date of Birth:  [birth year]     BSA:          1.494 m Patient Age:    78 years      BP:           130/81 mmHg Patient Gender: F             HR:           80 bpm. Exam Location:  Inpatient Procedure: 2D Echo and Intracardiac Opacification Agent (Both Spectral and Color            Flow Doppler were utilized during procedure). Indications:    CHF  History:        Patient has no  prior history of Echocardiogram examinations.                 CHF.  Sonographer:    Norleen Amour Referring Phys: DONALDA HERO GHIMIRE IMPRESSIONS  1. Left ventricular ejection fraction, by estimation, is <20%. The left ventricle has severely decreased function. The left ventricle demonstrates global hypokinesis. The left ventricular internal cavity size was severely dilated. Indeterminate diastolic filling due to E-A fusion. The average left ventricular global longitudinal strain is  -6.9 %. The global longitudinal strain is abnormal.  2. Right ventricular systolic function is normal. The right ventricular size is normal. There is normal pulmonary artery systolic pressure. The estimated right ventricular systolic pressure is 27.2 mmHg.  3. Left atrial size was mildly dilated.  4. The mitral valve is normal in structure. Mild to moderate mitral valve regurgitation. No evidence of mitral stenosis.  5. The aortic valve is tricuspid. Aortic valve regurgitation is not visualized. No aortic stenosis is present.  6. The inferior vena cava is normal in size with greater than 50% respiratory variability, suggesting right atrial pressure of 3 mmHg. FINDINGS  Left Ventricle: Left ventricular ejection fraction, by estimation, is <20%. The left ventricle has severely decreased function. The left ventricle demonstrates global hypokinesis. The average left ventricular global longitudinal strain is -6.9 %. Strain  was performed and the global longitudinal strain is abnormal. The left ventricular internal cavity size was severely dilated. There is no left ventricular hypertrophy. Indeterminate diastolic filling due to E-A fusion. Right Ventricle: The right ventricular size is normal. No increase in right ventricular wall thickness. Right ventricular systolic function is normal. There is normal pulmonary artery systolic pressure. The tricuspid regurgitant velocity is 2.46 m/s, and  with an assumed right atrial pressure of 3 mmHg, the estimated right ventricular systolic pressure is 27.2 mmHg. Left Atrium: Left atrial size was mildly dilated. Right Atrium: Right atrial size was normal in size. Pericardium: There is no evidence of pericardial effusion. Mitral Valve: The mitral valve is normal in structure. Mild to moderate mitral valve regurgitation. No evidence of mitral valve stenosis. MV peak gradient, 4.2 mmHg. The mean mitral valve gradient is 2.0 mmHg. Tricuspid Valve: The tricuspid valve is normal in  structure. Tricuspid valve regurgitation is trivial. Aortic Valve: The aortic valve is tricuspid. Aortic valve regurgitation is not visualized. No aortic stenosis is present. Pulmonic Valve: The pulmonic valve was not well visualized. Pulmonic valve regurgitation is not visualized. Aorta: The aortic root and ascending aorta are structurally normal, with no evidence of dilitation. Venous: The inferior vena cava is normal in size with greater than 50% respiratory variability, suggesting right atrial pressure of 3 mmHg. IAS/Shunts: The interatrial septum was not well visualized.  LEFT VENTRICLE PLAX 2D LVIDd:         5.00 cm      Diastology LVIDs:         3.30 cm      LV e' medial:  5.11 cm/s LV PW:         0.90 cm      LV e' lateral: 20.70 cm/s LV IVS:        0.70 cm LVOT diam:     1.80 cm      2D Longitudinal Strain LV SV:         30           2D Strain GLS Avg:     -6.9 % LV SV Index:   20 LVOT Area:  2.54 cm  LV Volumes (MOD) LV vol d, MOD A2C: 214.0 ml LV vol d, MOD A4C: 196.0 ml LV vol s, MOD A2C: 166.0 ml LV vol s, MOD A4C: 188.0 ml LV SV MOD A2C:     48.0 ml LV SV MOD A4C:     196.0 ml LV SV MOD BP:      29.0 ml RIGHT VENTRICLE             IVC RV Basal diam:  2.60 cm     IVC diam: 1.20 cm RV S prime:     13.80 cm/s TAPSE (M-mode): 2.2 cm      PULMONARY VEINS                             Diastolic Velocity: 29.80 cm/s                             S/D Velocity:       1.20                             Systolic Velocity:  34.60 cm/s LEFT ATRIUM             Index        RIGHT ATRIUM           Index LA diam:        4.10 cm 2.74 cm/m   RA Area:     11.00 cm LA Vol (A2C):   65.4 ml 43.77 ml/m  RA Volume:   21.20 ml  14.19 ml/m LA Vol (A4C):   51.2 ml 34.27 ml/m LA Biplane Vol: 58.8 ml 39.35 ml/m  AORTIC VALVE LVOT Vmax:   67.20 cm/s LVOT Vmean:  46.000 cm/s LVOT VTI:    0.116 m  AORTA Ao Root diam: 3.40 cm Ao Asc diam:  3.20 cm MITRAL VALVE             TRICUSPID VALVE MV Area VTI:  1.66 cm   TR Peak grad:    24.2 mmHg MV Peak grad: 4.2 mmHg   TR Vmax:        246.00 cm/s MV Mean grad: 2.0 mmHg MV Vmax:      1.03 m/s   SHUNTS MV Vmean:     67.0 cm/s  Systemic VTI:  0.12 m                          Systemic Diam: 1.80 cm Lonni Nanas MD Electronically signed by Lonni Nanas MD Signature Date/Time: 10/24/2024/4:07:26 PM    Final    DG Chest 2 View Result Date: 10/23/2024 EXAM: PA AND LATERAL (2) VIEW(S) XRAY OF THE CHEST 10/23/2024 06:31:16 AM COMPARISON: PA and lateral radiographs of the chest dated 09/22/2018 past history report. CLINICAL HISTORY: chest pain FINDINGS: LUNGS AND PLEURA: Since prior study, the patient has developed coarse interstitial disease with thickening of the interlobular septae suggesting interstitial edema. Trace bilateral pleural effusions. No pneumothorax. HEART AND MEDIASTINUM: Cardiomegaly. Aortic calcification. BONES AND SOFT TISSUES: No acute osseous abnormality. IMPRESSION: 1. Coarse interstitial disease with thickening of the interlobular septae, suggesting interstitial edema, developed since prior study. 2. Trace bilateral pleural effusions. 3. Cardiomegaly and aortic calcification. Electronically signed by: Evalene Coho MD 10/23/2024 06:40 AM EST RP Workstation: [id number]     Assessment and  Plan:  New onset HFrEF LBBB Patient with no prior cardiac history.  Echocardiogram this admission with EF less than 20% with severe dilation.  Normal RV mild to moderate MR.  RA pressure 3.  Etiology of this possibly could be related to ischemia as she has been reporting intermittent chest pains prior to admission and did not seek evaluation.  May also be related to chronic left bundle branch block that has been documented on EKG since at least 2014.  Fortunately she appears to be well compensated, euvolemic and warm on exam. Will plan for right and left heart catheterization tomorrow.  If unremarkable will likely need cardiac MRI. GDMT: She has been started on  spironolactone 25 mg.  Will titrate further GDMT after cardiac catheterization.  Avoid beta-blockers for now until we get better understanding of her cardiac output. Does not appear in shock, check lactate Stopping IV diuretics for now, appears euvolemic.  Continue to replete potassium as needed.  Informed Consent   Shared Decision Making/Informed Consent{  The risks [stroke (1 in 1000), death (1 in 1000), kidney failure [usually temporary] (1 in 500), bleeding (1 in 200), allergic reaction [possibly serious] (1 in 200)], benefits (diagnostic support and management of coronary artery disease) and alternatives of a cardiac catheterization were discussed in detail with Ms. Tiggs and she is willing to proceed.      Elevated LFTs Mild elevation.  May be related to hepatic congestion and downtrending after diuresis.   Risk Assessment/Risk Scores:     [place]  Heart Association (NYHA) Functional Class NYHA Class II       For questions or updates, please contact [facility] Please consult [email or web address] for contact info under    Signed, Thom LITTIE Sluder, PA-C  10/25/2024 9:10 AM    Patient seen and examined.  Agree with above documentation.  Jessica Davis is a 78 year old female with a history of hypertension, prior tobacco use who were consulted by Dr. Tobie for evaluation of acute combined heart failure.  She has no known cardiac history.  She presented with worsening shortness of breath.  Initial vital signs notable for BP 105/69, pulse ox 96% on room air, pulse 81.  Labs notable for creatinine 0.7, sodium 133, potassium 4.2, proBNP 5422, troponin 17 > 15, WBC 5.2, hemoglobin 12.4.  Chest x-ray showed interstitial edema.  EKG showed left bundle branch block, heart sinus rhythm, rate 91.  Echocardiogram showed EF less than 20%, severe LV dilatation, normal RV function, no significant valvular disease, RAP 3.  On exam, patient is alert and oriented, regular rate and rhythm, no murmurs,  lungs CTAB, no LE edema or JVD.  For her acute combined heart failure, unclear etiology.  Will need to rule out ischemic cardiomyopathy.  Plan LHC/RHC tomorrow.  She has been diuresed with IV Lasix, currently appears euvolemic.  Received IV Lasix this morning, will hold off further diuresis and follow-up results of RHC tomorrow.  She is on spironolactone, given soft BP will hold off on adding additional GDMT until after cath.  If cath unremarkable, will need CMR.  Lonni LITTIE Nanas, MD

## 2024-10-25 NOTE — Plan of Care (Signed)
  Problem: Education: Goal: Knowledge of General Education information will improve Description: Including pain rating scale, medication(s)/side effects and non-pharmacologic comfort measures Outcome: Progressing   Problem: Health Behavior/Discharge Planning: Goal: Ability to manage health-related needs will improve Outcome: Progressing   Problem: Clinical Measurements: Goal: Ability to maintain clinical measurements within normal limits will improve Outcome: Progressing Goal: Will remain free from infection Outcome: Progressing Goal: Diagnostic test results will improve Outcome: Progressing Goal: Respiratory complications will improve Outcome: Progressing Goal: Cardiovascular complication will be avoided Outcome: Progressing   Problem: Activity: Goal: Risk for activity intolerance will decrease Outcome: Progressing   Problem: Nutrition: Goal: Adequate nutrition will be maintained Outcome: Progressing   Problem: Elimination: Goal: Will not experience complications related to bowel motility Outcome: Progressing Goal: Will not experience complications related to urinary retention Outcome: Progressing   Problem: Pain Managment: Goal: General experience of comfort will improve and/or be controlled Outcome: Progressing   Problem: Safety: Goal: Ability to remain free from injury will improve Outcome: Progressing   Problem: Skin Integrity: Goal: Risk for impaired skin integrity will decrease Outcome: Progressing   Problem: Education: Goal: Knowledge of General Education information will improve Description: Including pain rating scale, medication(s)/side effects and non-pharmacologic comfort measures Outcome: Progressing   Problem: Health Behavior/Discharge Planning: Goal: Ability to manage health-related needs will improve Outcome: Progressing   Problem: Clinical Measurements: Goal: Ability to maintain clinical measurements within normal limits will  improve Outcome: Progressing Goal: Will remain free from infection Outcome: Progressing Goal: Diagnostic test results will improve Outcome: Progressing Goal: Respiratory complications will improve Outcome: Progressing Goal: Cardiovascular complication will be avoided Outcome: Progressing   Problem: Activity: Goal: Risk for activity intolerance will decrease Outcome: Progressing   Problem: Nutrition: Goal: Adequate nutrition will be maintained Outcome: Progressing   Problem: Elimination: Goal: Will not experience complications related to bowel motility Outcome: Progressing Goal: Will not experience complications related to urinary retention Outcome: Progressing   Problem: Pain Managment: Goal: General experience of comfort will improve and/or be controlled Outcome: Progressing   Problem: Safety: Goal: Ability to remain free from injury will improve Outcome: Progressing   Problem: Skin Integrity: Goal: Risk for impaired skin integrity will decrease Outcome: Progressing   Problem: Education: Goal: Knowledge of General Education information will improve Description: Including pain rating scale, medication(s)/side effects and non-pharmacologic comfort measures Outcome: Progressing   Problem: Health Behavior/Discharge Planning: Goal: Ability to manage health-related needs will improve Outcome: Progressing   Problem: Clinical Measurements: Goal: Ability to maintain clinical measurements within normal limits will improve Outcome: Progressing Goal: Will remain free from infection Outcome: Progressing Goal: Diagnostic test results will improve Outcome: Progressing Goal: Respiratory complications will improve Outcome: Progressing Goal: Cardiovascular complication will be avoided Outcome: Progressing   Problem: Activity: Goal: Risk for activity intolerance will decrease Outcome: Progressing   Problem: Nutrition: Goal: Adequate nutrition will be maintained Outcome:  Progressing   Problem: Elimination: Goal: Will not experience complications related to bowel motility Outcome: Progressing Goal: Will not experience complications related to urinary retention Outcome: Progressing   Problem: Pain Managment: Goal: General experience of comfort will improve and/or be controlled Outcome: Progressing   Problem: Safety: Goal: Ability to remain free from injury will improve Outcome: Progressing   Problem: Skin Integrity: Goal: Risk for impaired skin integrity will decrease Outcome: Progressing

## 2024-10-26 ENCOUNTER — Other Ambulatory Visit (HOSPITAL_COMMUNITY): Payer: Self-pay

## 2024-10-26 ENCOUNTER — Inpatient Hospital Stay (HOSPITAL_COMMUNITY)

## 2024-10-26 ENCOUNTER — Encounter (HOSPITAL_COMMUNITY): Payer: Self-pay | Admitting: Internal Medicine

## 2024-10-26 ENCOUNTER — Telehealth (HOSPITAL_COMMUNITY): Payer: Self-pay

## 2024-10-26 ENCOUNTER — Encounter (HOSPITAL_COMMUNITY): Admission: EM | Disposition: A | Payer: Self-pay | Source: Home / Self Care | Attending: Internal Medicine

## 2024-10-26 DIAGNOSIS — I509 Heart failure, unspecified: Secondary | ICD-10-CM

## 2024-10-26 DIAGNOSIS — I5021 Acute systolic (congestive) heart failure: Secondary | ICD-10-CM | POA: Diagnosis not present

## 2024-10-26 DIAGNOSIS — I2510 Atherosclerotic heart disease of native coronary artery without angina pectoris: Secondary | ICD-10-CM

## 2024-10-26 HISTORY — PX: RIGHT/LEFT HEART CATH AND CORONARY ANGIOGRAPHY: CATH118266

## 2024-10-26 LAB — POCT I-STAT 7, (LYTES, BLD GAS, ICA,H+H)
Acid-base deficit: 3 mmol/L — ABNORMAL HIGH (ref 0.0–2.0)
Bicarbonate: 20.8 mmol/L (ref 20.0–28.0)
Calcium, Ion: 0.99 mmol/L — ABNORMAL LOW (ref 1.15–1.40)
HCT: 34 % — ABNORMAL LOW (ref 36.0–46.0)
Hemoglobin: 11.6 g/dL — ABNORMAL LOW (ref 12.0–15.0)
O2 Saturation: 97 %
Potassium: 3.6 mmol/L (ref 3.5–5.1)
Sodium: 138 mmol/L (ref 135–145)
TCO2: 22 mmol/L (ref 22–32)
pCO2 arterial: 31.9 mmHg — ABNORMAL LOW (ref 32–48)
pH, Arterial: 7.423 (ref 7.35–7.45)
pO2, Arterial: 92 mmHg (ref 83–108)

## 2024-10-26 LAB — POCT I-STAT EG7
Acid-Base Excess: 0 mmol/L (ref 0.0–2.0)
Acid-base deficit: 1 mmol/L (ref 0.0–2.0)
Bicarbonate: 23.9 mmol/L (ref 20.0–28.0)
Bicarbonate: 24.9 mmol/L (ref 20.0–28.0)
Calcium, Ion: 1.13 mmol/L — ABNORMAL LOW (ref 1.15–1.40)
Calcium, Ion: 1.31 mmol/L (ref 1.15–1.40)
HCT: 34 % — ABNORMAL LOW (ref 36.0–46.0)
HCT: 35 % — ABNORMAL LOW (ref 36.0–46.0)
Hemoglobin: 11.6 g/dL — ABNORMAL LOW (ref 12.0–15.0)
Hemoglobin: 11.9 g/dL — ABNORMAL LOW (ref 12.0–15.0)
O2 Saturation: 66 %
O2 Saturation: 66 %
Potassium: 3.7 mmol/L (ref 3.5–5.1)
Potassium: 4 mmol/L (ref 3.5–5.1)
Sodium: 133 mmol/L — ABNORMAL LOW (ref 135–145)
Sodium: 135 mmol/L (ref 135–145)
TCO2: 25 mmol/L (ref 22–32)
TCO2: 26 mmol/L (ref 22–32)
pCO2, Ven: 41.5 mmHg — ABNORMAL LOW (ref 44–60)
pCO2, Ven: 42.5 mmHg — ABNORMAL LOW (ref 44–60)
pH, Ven: 7.367 (ref 7.25–7.43)
pH, Ven: 7.376 (ref 7.25–7.43)
pO2, Ven: 35 mmHg (ref 32–45)
pO2, Ven: 35 mmHg (ref 32–45)

## 2024-10-26 LAB — CBC
HCT: 37 % (ref 36.0–46.0)
Hemoglobin: 12.6 g/dL (ref 12.0–15.0)
MCH: 31.5 pg (ref 26.0–34.0)
MCHC: 34.1 g/dL (ref 30.0–36.0)
MCV: 92.5 fL (ref 80.0–100.0)
Platelets: 220 K/uL (ref 150–400)
RBC: 4 MIL/uL (ref 3.87–5.11)
RDW: 13.9 % (ref 11.5–15.5)
WBC: 5 K/uL (ref 4.0–10.5)
nRBC: 0 % (ref 0.0–0.2)

## 2024-10-26 LAB — COMPREHENSIVE METABOLIC PANEL WITH GFR
ALT: 40 U/L (ref 0–44)
AST: 29 U/L (ref 15–41)
Albumin: 3.7 g/dL (ref 3.5–5.0)
Alkaline Phosphatase: 52 U/L (ref 38–126)
Anion gap: 13 (ref 5–15)
BUN: 20 mg/dL (ref 8–23)
CO2: 22 mmol/L (ref 22–32)
Calcium: 9.3 mg/dL (ref 8.9–10.3)
Chloride: 98 mmol/L (ref 98–111)
Creatinine, Ser: 0.73 mg/dL (ref 0.44–1.00)
GFR, Estimated: >60 mL/min (ref >60)
Glucose, Bld: 92 mg/dL (ref 70–99)
Potassium: 4.4 mmol/L (ref 3.5–5.1)
Sodium: 133 mmol/L — ABNORMAL LOW (ref 135–145)
Total Bilirubin: 0.9 mg/dL (ref 0.0–1.2)
Total Protein: 6.8 g/dL (ref 6.5–8.1)

## 2024-10-26 LAB — TSH: TSH: 2.516 u[IU]/mL (ref 0.350–4.500)

## 2024-10-26 LAB — MAGNESIUM: Magnesium: 2.1 mg/dL (ref 1.7–2.4)

## 2024-10-26 SURGERY — RIGHT/LEFT HEART CATH AND CORONARY ANGIOGRAPHY
Anesthesia: LOCAL

## 2024-10-26 MED ORDER — MIDAZOLAM HCL (PF) 2 MG/2ML IJ SOLN
INTRAMUSCULAR | Status: DC | PRN
  Administered 2024-10-26: 1 mg via INTRAVENOUS

## 2024-10-26 MED ORDER — VERAPAMIL HCL 2.5 MG/ML IV SOLN
INTRAVENOUS | Status: AC
Start: 2024-10-26 — End: 2024-10-26
  Filled 2024-10-26: qty 2

## 2024-10-26 MED ORDER — HEPARIN SODIUM (PORCINE) 1000 UNIT/ML IJ SOLN
INTRAMUSCULAR | Status: DC | PRN
  Administered 2024-10-26: 3000 [IU] via INTRAVENOUS

## 2024-10-26 MED ORDER — VERAPAMIL HCL 2.5 MG/ML IV SOLN
INTRAVENOUS | Status: DC | PRN
  Administered 2024-10-26: 10 mL via INTRA_ARTERIAL

## 2024-10-26 MED ORDER — HEPARIN SODIUM (PORCINE) 1000 UNIT/ML IJ SOLN
INTRAMUSCULAR | Status: AC
  Filled 2024-10-26: qty 10

## 2024-10-26 MED ORDER — LIDOCAINE HCL (PF) 1 % IJ SOLN
INTRAMUSCULAR | Status: DC | PRN
  Administered 2024-10-26 (×2): 5 mL

## 2024-10-26 MED ORDER — LIDOCAINE HCL (PF) 1 % IJ SOLN
INTRAMUSCULAR | Status: AC
Start: 2024-10-26 — End: 2024-10-26
  Filled 2024-10-26: qty 30

## 2024-10-26 MED ORDER — HEPARIN (PORCINE) IN NACL 2000-0.9 UNIT/L-% IV SOLN
INTRAVENOUS | Status: DC | PRN
  Administered 2024-10-26: 1000 mL

## 2024-10-26 MED ORDER — MIDAZOLAM HCL 2 MG/2ML IJ SOLN
INTRAMUSCULAR | Status: AC
  Filled 2024-10-26: qty 2

## 2024-10-26 MED ORDER — FENTANYL CITRATE (PF) 100 MCG/2ML IJ SOLN
INTRAMUSCULAR | Status: AC
  Filled 2024-10-26: qty 2

## 2024-10-26 MED ORDER — IOHEXOL 350 MG/ML SOLN
INTRAVENOUS | Status: DC | PRN
  Administered 2024-10-26: 60 mL

## 2024-10-26 MED ORDER — HYDRALAZINE HCL 20 MG/ML IJ SOLN: 10.0000 mg | INTRAMUSCULAR | Status: AC | PRN

## 2024-10-26 MED ORDER — GADOBUTROL 1 MMOL/ML IV SOLN
8.0000 mL | Freq: Once | INTRAVENOUS | Status: AC | PRN
  Administered 2024-10-26: 8 mL via INTRAVENOUS

## 2024-10-26 MED ORDER — SODIUM CHLORIDE 0.9% FLUSH: 3.0000 mL | INTRAVENOUS | Status: DC | PRN

## 2024-10-26 MED ORDER — SACUBITRIL-VALSARTAN 24-26 MG PO TABS
1.0000 | ORAL_TABLET | Freq: Two times a day (BID) | ORAL | Status: DC
  Administered 2024-10-26 – 2024-10-27 (×3): 1 via ORAL
  Filled 2024-10-26 (×3): qty 1

## 2024-10-26 MED ORDER — ROSUVASTATIN CALCIUM 20 MG PO TABS
20.0000 mg | ORAL_TABLET | Freq: Every day | ORAL | Status: DC
  Administered 2024-10-26 – 2024-10-27 (×2): 20 mg via ORAL
  Filled 2024-10-26 (×2): qty 1

## 2024-10-26 MED ORDER — FENTANYL CITRATE (PF) 100 MCG/2ML IJ SOLN
INTRAMUSCULAR | Status: DC | PRN
  Administered 2024-10-26: 25 ug via INTRAVENOUS

## 2024-10-26 MED ORDER — SODIUM CHLORIDE 0.9% FLUSH
3.0000 mL | Freq: Two times a day (BID) | INTRAVENOUS | Status: DC
  Administered 2024-10-26 – 2024-10-27 (×3): 3 mL via INTRAVENOUS

## 2024-10-26 MED ORDER — SODIUM CHLORIDE 0.9 % IV SOLN: 250.0000 mL | INTRAVENOUS | Status: DC | PRN

## 2024-10-26 SURGICAL SUPPLY — 13 items
CATH BALLN WEDGE 5F 110CM (CATHETERS) IMPLANT
CATH INFINITI 5 FR JL3.5 (CATHETERS) IMPLANT
CATH INFINITI JR4 5F (CATHETERS) IMPLANT
CATH SWAN GANZ 7F STRAIGHT (CATHETERS) IMPLANT
DEVICE RAD COMP TR BAND LRG (VASCULAR PRODUCTS) IMPLANT
DEVICE RAD TR BAND REGULAR (VASCULAR PRODUCTS) IMPLANT
GLIDESHEATH SLEND SS 6F .021 (SHEATH) IMPLANT
GLIDESHEATH SLENDER 7FR .021G (SHEATH) IMPLANT
GUIDEWIRE .025 260CM (WIRE) IMPLANT
GUIDEWIRE INQWIRE 1.5J.035X260 (WIRE) IMPLANT
KIT HEART LEFT (KITS) IMPLANT
PACK CARDIAC CATHETERIZATION (CUSTOM PROCEDURE TRAY) ×1 IMPLANT
SHEATH GLIDE SLENDER 4/5FR (SHEATH) IMPLANT

## 2024-10-26 NOTE — Progress Notes (Signed)
 Progress Note  Patient Name: Jessica Davis Date of Encounter: 10/26/2024  Primary Cardiologist: None   Subjective   Doing well post cath. No complaints. All questions and concerns answered  Inpatient Medications    Scheduled Meds:  aspirin EC  81 mg Oral Daily   enoxaparin (LOVENOX) injection  40 mg Subcutaneous Q24H   pantoprazole  40 mg Oral Q1200   sodium chloride flush  3 mL Intravenous Q12H   spironolactone  25 mg Oral Daily   Continuous Infusions:  PRN Meds: acetaminophen, alum & mag hydroxide-simeth, ipratropium-albuterol, LORazepam, melatonin, ondansetron (ZOFRAN) IV, sodium chloride flush   Vital Signs    Vitals:   10/25/24 1700 10/25/24 1930 10/26/24 0636 10/26/24 0715  BP: 111/71 117/79 120/82 112/81  Pulse: 79 74 78 75  Resp: 20 19 18 20   Temp: 98 F (36.7 C) 98.2 F (36.8 C) (!) 97.4 F (36.3 C) 97.6 F (36.4 C)  TempSrc: Oral Oral Oral Oral  SpO2: 95% 97% 97% 96%  Weight:   50.3 kg   Height:        Intake/Output Summary (Last 24 hours) at 10/26/2024 9187 Last data filed at 10/26/2024 0600 Gross per 24 hour  Intake 1340 ml  Output --  Net 1340 ml   Filed Weights   10/24/24 0530 10/25/24 0546 10/26/24 0636  Weight: 50.8 kg 50.6 kg 50.3 kg    Telemetry    Sinus with PVCs - Personally Reviewed  ECG    Sinus with LBBB - Personally Reviewed  Physical Exam   Physical Exam Vitals and nursing note reviewed.  Constitutional:      Appearance: Normal appearance.  HENT:     Head: Normocephalic and atraumatic.  Eyes:     Conjunctiva/sclera: Conjunctivae normal.  Cardiovascular:     Rate and Rhythm: Normal rate and regular rhythm.  Pulmonary:     Effort: Pulmonary effort is normal.     Breath sounds: Normal breath sounds.  Musculoskeletal:        General: No swelling or tenderness.  Skin:    Coloration: Skin is not jaundiced or pale.  Neurological:     Mental Status: She is alert.      Labs    Chemistry Recent Labs  Lab  10/23/24 0604 10/23/24 1811 10/24/24 0205 10/25/24 0207  NA 133*  --  131* 132*  K 4.2  --  3.7 3.3*  CL 96*  --  94* 94*  CO2 25  --  28 26  GLUCOSE 102*  --  106* 91  BUN 16  --  16 21  CREATININE 0.66 0.83 0.78 0.75  CALCIUM 9.9  --  9.0 8.9  PROT 7.5  --  6.7  --   ALBUMIN 4.4  --  3.8  --   AST 70*  --  42*  --   ALT 101*  --  61*  --   ALKPHOS 72  --  53  --   BILITOT 0.7  --  1.2  --   GFRNONAA >60 >60 >60 >60  ANIONGAP 11  --  9 12     Hematology Recent Labs  Lab 10/23/24 0604 10/23/24 1811  WBC 5.2 6.0  RBC 3.90 4.00  HGB 12.4 12.8  HCT 36.6 36.9  MCV 93.8 92.3  MCH 31.8 32.0  MCHC 33.9 34.7  RDW 14.0 13.8  PLT 211 232    Cardiac EnzymesNo results for input(s): TROPONINI in the last 168 hours. No results for input(s):  TROPIPOC in the last 168 hours.   BNP Recent Labs  Lab 10/23/24 0604  PROBNP 5,422.0*     DDimer  Recent Labs  Lab 10/23/24 0604  DDIMER 0.67*     Radiology    ECHOCARDIOGRAM COMPLETE Result Date: 10/24/2024    ECHOCARDIOGRAM REPORT   Patient Name:   Jessica Davis Date of Exam: 10/24/2024 Medical Rec #:  [id number]     Height:       62.0 in Accession #:    [id number]    Weight:       112.0 lb Date of Birth:  1946/10/06     BSA:          1.494 m Patient Age:    78 years      BP:           130/81 mmHg Patient Gender: F             HR:           80 bpm. Exam Location:  Inpatient Procedure: 2D Echo and Intracardiac Opacification Agent (Both Spectral and Color            Flow Doppler were utilized during procedure). Indications:    CHF  History:        Patient has no prior history of Echocardiogram examinations.                 CHF.  Sonographer:    Norleen Amour Referring Phys: DONALDA HERO GHIMIRE IMPRESSIONS  1. Left ventricular ejection fraction, by estimation, is <20%. The left ventricle has severely decreased function. The left ventricle demonstrates global hypokinesis. The left ventricular internal cavity size was severely dilated.  Indeterminate diastolic filling due to E-A fusion. The average left ventricular global longitudinal strain is -6.9 %. The global longitudinal strain is abnormal.  2. Right ventricular systolic function is normal. The right ventricular size is normal. There is normal pulmonary artery systolic pressure. The estimated right ventricular systolic pressure is 27.2 mmHg.  3. Left atrial size was mildly dilated.  4. The mitral valve is normal in structure. Mild to moderate mitral valve regurgitation. No evidence of mitral stenosis.  5. The aortic valve is tricuspid. Aortic valve regurgitation is not visualized. No aortic stenosis is present.  6. The inferior vena cava is normal in size with greater than 50% respiratory variability, suggesting right atrial pressure of 3 mmHg. FINDINGS  Left Ventricle: Left ventricular ejection fraction, by estimation, is <20%. The left ventricle has severely decreased function. The left ventricle demonstrates global hypokinesis. The average left ventricular global longitudinal strain is -6.9 %. Strain  was performed and the global longitudinal strain is abnormal. The left ventricular internal cavity size was severely dilated. There is no left ventricular hypertrophy. Indeterminate diastolic filling due to E-A fusion. Right Ventricle: The right ventricular size is normal. No increase in right ventricular wall thickness. Right ventricular systolic function is normal. There is normal pulmonary artery systolic pressure. The tricuspid regurgitant velocity is 2.46 m/s, and  with an assumed right atrial pressure of 3 mmHg, the estimated right ventricular systolic pressure is 27.2 mmHg. Left Atrium: Left atrial size was mildly dilated. Right Atrium: Right atrial size was normal in size. Pericardium: There is no evidence of pericardial effusion. Mitral Valve: The mitral valve is normal in structure. Mild to moderate mitral valve regurgitation. No evidence of mitral valve stenosis. MV peak gradient,  4.2 mmHg. The mean mitral valve gradient is 2.0 mmHg. Tricuspid Valve: The tricuspid  valve is normal in structure. Tricuspid valve regurgitation is trivial. Aortic Valve: The aortic valve is tricuspid. Aortic valve regurgitation is not visualized. No aortic stenosis is present. Pulmonic Valve: The pulmonic valve was not well visualized. Pulmonic valve regurgitation is not visualized. Aorta: The aortic root and ascending aorta are structurally normal, with no evidence of dilitation. Venous: The inferior vena cava is normal in size with greater than 50% respiratory variability, suggesting right atrial pressure of 3 mmHg. IAS/Shunts: The interatrial septum was not well visualized.  LEFT VENTRICLE PLAX 2D LVIDd:         5.00 cm      Diastology LVIDs:         3.30 cm      LV e' medial:  5.11 cm/s LV PW:         0.90 cm      LV e' lateral: 20.70 cm/s LV IVS:        0.70 cm LVOT diam:     1.80 cm      2D Longitudinal Strain LV SV:         30           2D Strain GLS Avg:     -6.9 % LV SV Index:   20 LVOT Area:     2.54 cm  LV Volumes (MOD) LV vol d, MOD A2C: 214.0 ml LV vol d, MOD A4C: 196.0 ml LV vol s, MOD A2C: 166.0 ml LV vol s, MOD A4C: 188.0 ml LV SV MOD A2C:     48.0 ml LV SV MOD A4C:     196.0 ml LV SV MOD BP:      29.0 ml RIGHT VENTRICLE             IVC RV Basal diam:  2.60 cm     IVC diam: 1.20 cm RV S prime:     13.80 cm/s TAPSE (M-mode): 2.2 cm      PULMONARY VEINS                             Diastolic Velocity: 29.80 cm/s                             S/D Velocity:       1.20                             Systolic Velocity:  34.60 cm/s LEFT ATRIUM             Index        RIGHT ATRIUM           Index LA diam:        4.10 cm 2.74 cm/m   RA Area:     11.00 cm LA Vol (A2C):   65.4 ml 43.77 ml/m  RA Volume:   21.20 ml  14.19 ml/m LA Vol (A4C):   51.2 ml 34.27 ml/m LA Biplane Vol: 58.8 ml 39.35 ml/m  AORTIC VALVE LVOT Vmax:   67.20 cm/s LVOT Vmean:  46.000 cm/s LVOT VTI:    0.116 m  AORTA Ao Root diam: 3.40 cm Ao  Asc diam:  3.20 cm MITRAL VALVE             TRICUSPID VALVE MV Area VTI:  1.66 cm   TR Peak grad:   24.2 mmHg MV  Peak grad: 4.2 mmHg   TR Vmax:        246.00 cm/s MV Mean grad: 2.0 mmHg MV Vmax:      1.03 m/s   SHUNTS MV Vmean:     67.0 cm/s  Systemic VTI:  0.12 m                          Systemic Diam: 1.80 cm Lonni Nanas MD Electronically signed by Lonni Nanas MD Signature Date/Time: 10/24/2024/4:07:26 PM    Final     Cardiac Studies  [facility] 10/26/24:   Prox LAD to Mid LAD lesion is 75% stenosed.   Acute Mrg lesion is 95% stenosed.   Prox RCA lesion is 30% stenosed.   Mid LAD lesion is 60% stenosed.   2nd Diag lesion is 50% stenosed.   The left ventricular ejection fraction is less than 25% by visual estimate.   Findings:   Ao = 96/68 (82) LV = 104/21 RA =  5 RV = 23/8 PA = 22/11 (17) PCW = 11 Fick cardiac output/index = 3.8/2.5 PVR = 1.6 WU Ao sat = 97% PA sat = 66%, 66% PAPi = 2.2  Echo 10/24/24:  1. Left ventricular ejection fraction, by estimation, is <20%. The left  ventricle has severely decreased function. The left ventricle demonstrates  global hypokinesis. The left ventricular internal cavity size was severely  dilated. Indeterminate  diastolic filling due to E-A fusion. The average left ventricular global  longitudinal strain is -6.9 %. The global longitudinal strain is abnormal.   2. Right ventricular systolic function is normal. The right ventricular  size is normal. There is normal pulmonary artery systolic pressure. The  estimated right ventricular systolic pressure is 27.2 mmHg.   3. Left atrial size was mildly dilated.   4. The mitral valve is normal in structure. Mild to moderate mitral valve  regurgitation. No evidence of mitral stenosis.   5. The aortic valve is tricuspid. Aortic valve regurgitation is not  visualized. No aortic stenosis is present.   6. The inferior vena cava is normal in size with greater than 50%  respiratory  variability, suggesting right atrial pressure of 3 mmHg.    Patient Profile     78 y.o. female with a history of remote tobacco use, hypertension who presented to [facility] with 1 week of intermittent chest pain and shortness of breath and diagnosed with new onset HFrEF with EF less than 20% and severe dilatation and global hypokinesis, prompting cardiology consult on 10/25/2024.  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  Has been diuresed with 9 kg weight loss.  No labs today. GDMT: spironolactone 25 mg. Start on Entresto 24-26 mg BID. CMR ordered.  LBBB - QRS >150. Will likely need outpatient CRT CAD -  LHC 10/26/24:   Prox LAD to Mid LAD lesion is 75% stenosed.   Acute Mrg lesion is 95% stenosed.   Prox RCA lesion is 30% stenosed.   Mid LAD lesion is 60% stenosed.   2nd Diag lesion is 50% stenosed. - Continue aspirin 81 mg - start rosuvastatin 20 mg - lipid panel - hold off on PCI for now given results of the REVISED BCIS2 Trial  Hypertension Remote Tobacco use      For questions or updates, please contact [facility] Please consult [email or web address] for contact info under  SignedEmeline Calender, DO 10/26/2024, 8:12 AM

## 2024-10-26 NOTE — Progress Notes (Signed)
 [facility] Progress Note Patient: Jessica Davis FMW:[id number] DOB: [birth year]  DOA: 10/23/2024 DOS: the patient was seen and examined on 10/26/2024  Brief Hospital Course: 78/F history of hypertension, now diet controlled presented to the ED with progressive dyspnea on exertion, intermittent chest discomfort for over 1 week, along with PND and orthopnea. -Labs with BNP 5422, troponin 17, creatinine one 0.6, sodium 133, chest x-ray with interstitial edema and pleural effusions.\consulted.   Assessment and Plan: Acute systolic CHF Echocardiogram shows EF less than 20%. Cardiology consulted. Volume status appears to be improving. Underwent right and left heart catheterization on 11/17. Has two-vessel CAD.  Nonobstructive. Medical management recommended. Hemodynamics appears to be well compensated as well. Further workup per cardiology.  MRI planned.   Hypertension Blood pressure improving, diuretics as above, continue Aldactone. Now on Entresto.  GERD. On PPI.  HLD. On Crestor.  Insomnia Continue melatonin.   Subjective: No nausea no vomiting.  No other acute complaint.  No fever no chills.  Physical Exam: Clear to auscultation. No stenosis. Bowel sounds No edema.  Data Reviewed: I have Reviewed nursing notes, Vitals, and Lab results. Since last encounter, pertinent lab results CBC and BMP   . I have ordered test including CBC and BMP  .  Reviewed notes from cardiology  Disposition: Status is: Inpatient Remains inpatient appropriate because: Plan for medical management and MRI  enoxaparin (LOVENOX) injection 40 mg Start: 10/23/24 1830   Family Communication: No one at bedside Level of care: Telemetry   Vitals:   10/26/24 1305 10/26/24 1310 10/26/24 1315 10/26/24 1505  BP: 113/66 112/69 112/72 122/75  Pulse: 72 76 75 69  Resp:      Temp:      TempSrc:      SpO2: 97% 97% 97% 96%  Weight:      Height:         Author: Yetta Blanch, MD 10/26/2024  5:10 PM  Please look on [email or web address] to find out who is on call.

## 2024-10-26 NOTE — Plan of Care (Signed)
  Problem: Clinical Measurements: Goal: Ability to maintain clinical measurements within normal limits will improve Outcome: Progressing   Problem: Education: Goal: Knowledge of General Education information will improve Description: Including pain rating scale, medication(s)/side effects and non-pharmacologic comfort measures Outcome: Progressing   Problem: Clinical Measurements: Goal: Cardiovascular complication will be avoided Outcome: Progressing   Problem: Safety: Goal: Ability to remain free from injury will improve Outcome: Progressing   Problem: Pain Managment: Goal: General experience of comfort will improve and/or be controlled Outcome: Progressing   Problem: Skin Integrity: Goal: Risk for impaired skin integrity will decrease Outcome: Progressing   Problem: Education: Goal: Understanding of CV disease, CV risk reduction, and recovery process will improve Outcome: Progressing

## 2024-10-26 NOTE — Interval H&P Note (Signed)
 History and Physical Interval Note:  10/26/2024 11:35 AM  Jessica Davis  has presented today for surgery, with the diagnosis of unstable angina.  The various methods of treatment have been discussed with the patient and family. After consideration of risks, benefits and other options for treatment, the patient has consented to  Procedure(s): RIGHT/LEFT HEART CATH AND CORONARY ANGIOGRAPHY (N/A) as a surgical intervention.  The patient's history has been reviewed, patient examined, no change in status, stable for surgery.  I have reviewed the patient's chart and labs.  Questions were answered to the patient's satisfaction.     Chaniya Genter

## 2024-10-26 NOTE — Hospital Course (Signed)
 78/F history of hypertension, now diet controlled presented to the ED with progressive dyspnea on exertion, intermittent chest discomfort for over 1 week, along with PND and orthopnea. -Labs with BNP 5422, troponin 17, creatinine one 0.6, sodium 133, chest x-ray with interstitial edema and pleural effusions.\ Cardiology consulted.   Assessment and Plan: Acute systolic CHF Echocardiogram shows EF less than 20%. Cardiology consulted. Volume status appears to be improving. Underwent right and left heart catheterization on 11/17. Has two-vessel CAD.  Nonobstructive. Medical management recommended. Hemodynamics appears to be well compensated as well. Further workup per cardiology.  MRI planned.   Hypertension Blood pressure improving, diuretics as above, continue Aldactone. Now on Entresto.  GERD. On PPI.  HLD. On Crestor.  Insomnia Continue melatonin.

## 2024-10-26 NOTE — Telephone Encounter (Signed)
 Pharmacy Patient Advocate Encounter  Insurance verification completed.    The patient is insured through [employer]. Patient has Medicare and is not eligible for a copay card, but may be able to apply for patient assistance or Medicare RX Payment Plan (Patient Must reach out to their plan, if eligible for payment plan), if available.    Ran test claim for Entresto 24-26mg  and the current 30 day co-pay is $4.90.   This test claim was processed through [facility]- copay amounts may vary at other pharmacies due to pharmacy/plan contracts, or as the patient moves through the different stages of their insurance plan.

## 2024-10-27 ENCOUNTER — Other Ambulatory Visit (HOSPITAL_COMMUNITY): Payer: Self-pay

## 2024-10-27 ENCOUNTER — Encounter (HOSPITAL_COMMUNITY): Payer: Self-pay | Admitting: Internal Medicine

## 2024-10-27 ENCOUNTER — Telehealth (HOSPITAL_COMMUNITY): Payer: Self-pay

## 2024-10-27 DIAGNOSIS — I509 Heart failure, unspecified: Secondary | ICD-10-CM | POA: Diagnosis not present

## 2024-10-27 DIAGNOSIS — I5021 Acute systolic (congestive) heart failure: Secondary | ICD-10-CM | POA: Diagnosis not present

## 2024-10-27 LAB — LIPID PANEL
Cholesterol: 208 mg/dL — ABNORMAL HIGH (ref 0–200)
HDL: 61 mg/dL (ref >40)
LDL Cholesterol: 127 mg/dL — ABNORMAL HIGH (ref 0–99)
Total CHOL/HDL Ratio: 3.4 ratio
Triglycerides: 98 mg/dL (ref <150)
VLDL: 20 mg/dL (ref 0–40)

## 2024-10-27 LAB — COMPREHENSIVE METABOLIC PANEL WITH GFR
ALT: 32 U/L (ref 0–44)
AST: 26 U/L (ref 15–41)
Albumin: 3.4 g/dL — ABNORMAL LOW (ref 3.5–5.0)
Alkaline Phosphatase: 49 U/L (ref 38–126)
Anion gap: 10 (ref 5–15)
BUN: 22 mg/dL (ref 8–23)
CO2: 22 mmol/L (ref 22–32)
Calcium: 8.9 mg/dL (ref 8.9–10.3)
Chloride: 99 mmol/L (ref 98–111)
Creatinine, Ser: 0.85 mg/dL (ref 0.44–1.00)
GFR, Estimated: >60 mL/min (ref >60)
Glucose, Bld: 97 mg/dL (ref 70–99)
Potassium: 4.2 mmol/L (ref 3.5–5.1)
Sodium: 131 mmol/L — ABNORMAL LOW (ref 135–145)
Total Bilirubin: 0.6 mg/dL (ref 0.0–1.2)
Total Protein: 6.3 g/dL — ABNORMAL LOW (ref 6.5–8.1)

## 2024-10-27 LAB — CBC
HCT: 38.1 % (ref 36.0–46.0)
Hemoglobin: 12.7 g/dL (ref 12.0–15.0)
MCH: 31.1 pg (ref 26.0–34.0)
MCHC: 33.3 g/dL (ref 30.0–36.0)
MCV: 93.4 fL (ref 80.0–100.0)
Platelets: 224 K/uL (ref 150–400)
RBC: 4.08 MIL/uL (ref 3.87–5.11)
RDW: 13.8 % (ref 11.5–15.5)
WBC: 5.1 K/uL (ref 4.0–10.5)
nRBC: 0 % (ref 0.0–0.2)

## 2024-10-27 MED ORDER — SACUBITRIL-VALSARTAN 24-26 MG PO TABS
1.0000 | ORAL_TABLET | Freq: Two times a day (BID) | ORAL | 0 refills | Status: DC
  Filled 2024-10-27: qty 60, 30d supply, fill #0

## 2024-10-27 MED ORDER — ROSUVASTATIN CALCIUM 20 MG PO TABS
20.0000 mg | ORAL_TABLET | Freq: Every day | ORAL | 0 refills | Status: DC
  Filled 2024-10-27: qty 30, 30d supply, fill #0

## 2024-10-27 MED ORDER — SPIRONOLACTONE 25 MG PO TABS
25.0000 mg | ORAL_TABLET | Freq: Every day | ORAL | 0 refills | Status: DC
  Filled 2024-10-27: qty 30, 30d supply, fill #0

## 2024-10-27 MED ORDER — ASPIRIN 81 MG PO TBEC
81.0000 mg | DELAYED_RELEASE_TABLET | Freq: Every day | ORAL | 1 refills | Status: AC
Start: 2024-10-28 — End: ?
  Filled 2024-10-27: qty 30, 30d supply, fill #0

## 2024-10-27 MED ORDER — PANTOPRAZOLE SODIUM 40 MG PO TBEC
40.0000 mg | DELAYED_RELEASE_TABLET | Freq: Every day | ORAL | 0 refills | Status: DC
  Filled 2024-10-27: qty 30, 30d supply, fill #0

## 2024-10-27 NOTE — Progress Notes (Signed)
 Heart Failure Nurse Navigator Progress Note  PCP: Patient, No Pcp Per PCP-Cardiologist: None Admission Diagnosis: None Admitted from: Home  Presentation:   Jessica Davis presented with chest pain and shortness of breath,that is worse with exertion, having trouble sleeping ,  started having URI symptoms at home and was self treating. BP 147/81, HR 97, Pro BNP 5,422, CXR with small pleural effusion, interstitial edema. Echocardiogram this admission with EF less than 20% with severe dilation.  Normal RV mild to moderate MR.  RA pressure 3. Right / Left Cath 11/17 with found to have 2 vessel nonobstructive CAD with borderline lesion in mLAD, RA 5, PA 17, wedge 11, CO 3.75, CI 2.5, PAPi 2.2. Suspect NICM/LBBB cardiomyopathy.   Patient was educated on the sign and symptoms of heart failure, daily weights, when to call her doctor or go to the ED. Diet/ fluid restrictions, taking all medications as prescribed and attending all medical appointments. Patient verbalized her understanding of education. A HF TOC appointment was scheduled for 11/02/2024 @ 10:30 am.   ECHO/ LVEF: < 20% New  Clinical Course:  Past Medical History:  Diagnosis Date   Chronic back pain    Dizziness      Social History   Socioeconomic History   Marital status: Legally Separated    Spouse name: Not on file   Number of children: 1   Years of education: BA   Highest education level: Not on file  Occupational History    Comment: Retired  Tobacco Use   Smoking status: Former   Smokeless tobacco: Never  Substance and Sexual Activity   Alcohol use: Yes    Alcohol/week: 1.0 standard drink of alcohol    Types: 1 drink(s) per week    Comment: OCC  once a year   Drug use: No   Sexual activity: Not on file  Other Topics Concern   Not on file  Social History Narrative   Patient is retired and lives at home alone college education. Patient has one child. Patient right handed.   Caffeine -one daily.   Social Drivers of  Corporate Investment Banker Strain: Not on file  Food Insecurity: No Food Insecurity (10/23/2024)   Hunger Vital Sign    Worried About Running Out of Food in the Last Year: Never true    Ran Out of Food in the Last Year: Never true  Transportation Needs: Unmet Transportation Needs (10/23/2024)   PRAPARE - Administrator, Civil Service (Medical): Yes    Lack of Transportation (Non-Medical): No  Physical Activity: Not on file  Stress: Not on file  Social Connections: Patient Declined (10/23/2024)   Social Connection and Isolation Panel    Frequency of Communication with Friends and Family: Patient declined    Frequency of Social Gatherings with Friends and Family: Patient declined    Attends Religious Services: Patient declined    Database Administrator or Organizations: Patient declined    Attends Engineer, Structural: Patient declined    Marital Status: Patient declined   Education Assessment and Provision:  Detailed education and instructions provided on heart failure disease management including the following:  Signs and symptoms of Heart Failure When to call the physician Importance of daily weights Low sodium diet Fluid restriction Medication management Anticipated future follow-up appointments  Patient education given on each of the above topics.  Patient acknowledges understanding via teach back method and acceptance of all instructions.  Education Materials:  Living Better With  Heart Failure Booklet, HF zone tool, & Daily Weight Tracker Tool.  Patient has scale at home: Yes Patient has pill box at home: Yes    High Risk Criteria for Readmission and/or Poor Patient Outcomes: Heart failure hospital admissions (last 6 months): 1  No Show rate: 0 Difficult social situation: No Demonstrates medication adherence: Yes Primary Language: English  Literacy level: Reading, writing, and comprehension  Barriers of Care:   Diet/ fluid  restrictions Daily weights  Considerations/Referrals:   Referral made to Heart Failure Pharmacist Stewardship: Yes Referral made to Heart Failure CSW/NCM TOC: NA Referral made to Heart & Vascular TOC clinic: Yes, 11/02/2024 @ 10:30 am   Items for Follow-up on DC/TOC: Continued HF education Diet/ fluid restrictions/ daily weights   Stephane Haddock, BSN, RN Heart Failure Teacher, Adult Education Only

## 2024-10-27 NOTE — Progress Notes (Signed)
 Progress Note  Patient Name: Jessica Davis Date of Encounter: 10/27/2024  Primary Cardiologist: Lonni LITTIE Nanas, MD   Subjective   She is feeling a bit dizzy right now. She took all her AM meds about an hour ago. BP checked and is normotensive. Just came back from a walk down the hall without issues. No other complaints at this time.   Inpatient Medications    Scheduled Meds:  aspirin EC  81 mg Oral Daily   enoxaparin (LOVENOX) injection  40 mg Subcutaneous Q24H   pantoprazole  40 mg Oral Q1200   rosuvastatin  20 mg Oral Daily   sacubitril-valsartan  1 tablet Oral BID   sodium chloride flush  3 mL Intravenous Q12H   sodium chloride flush  3 mL Intravenous Q12H   spironolactone  25 mg Oral Daily   Continuous Infusions:  sodium chloride     PRN Meds: sodium chloride, acetaminophen, alum & mag hydroxide-simeth, ipratropium-albuterol, LORazepam, melatonin, ondansetron (ZOFRAN) IV, sodium chloride flush, sodium chloride flush   Vital Signs    Vitals:   10/26/24 2312 10/27/24 0400 10/27/24 0500 10/27/24 0713  BP: 101/66 106/65    Pulse: 69 73  88  Resp: 18 18  18   Temp: 97.6 F (36.4 C) 97.9 F (36.6 C)  97.8 F (36.6 C)  TempSrc: Oral Oral  Oral  SpO2: 94% 96%  96%  Weight:   50.4 kg   Height:        Intake/Output Summary (Last 24 hours) at 10/27/2024 0830 Last data filed at 10/26/2024 2128 Gross per 24 hour  Intake 123 ml  Output --  Net 123 ml   Filed Weights   10/25/24 0546 10/26/24 0636 10/27/24 0500  Weight: 50.6 kg 50.3 kg 50.4 kg    Telemetry    Sinus with PVCs - Personally Reviewed  ECG    Sinus with LBBB - Personally Reviewed  Physical Exam   Physical Exam Vitals and nursing note reviewed.  Constitutional:      Appearance: Normal appearance.  HENT:     Head: Normocephalic and atraumatic.  Eyes:     Conjunctiva/sclera: Conjunctivae normal.  Cardiovascular:     Rate and Rhythm: Normal rate and regular rhythm.  Pulmonary:      Effort: Pulmonary effort is normal.     Breath sounds: Normal breath sounds.  Musculoskeletal:        General: No swelling or tenderness.  Skin:    Coloration: Skin is not jaundiced or pale.  Neurological:     Mental Status: She is alert.      Labs    Chemistry Recent Labs  Lab 10/24/24 0205 10/25/24 0207 10/26/24 0800 10/26/24 1206 10/26/24 1221 10/27/24 0247  NA 131* 132* 133* 138 135  133* 131*  K 3.7 3.3* 4.4 3.6 3.7  4.0 4.2  CL 94* 94* 98  --   --  99  CO2 28 26 22   --   --  22  GLUCOSE 106* 91 92  --   --  97  BUN 16 21 20   --   --  22  CREATININE 0.78 0.75 0.73  --   --  0.85  CALCIUM 9.0 8.9 9.3  --   --  8.9  PROT 6.7  --  6.8  --   --  6.3*  ALBUMIN 3.8  --  3.7  --   --  3.4*  AST 42*  --  29  --   --  26  ALT 61*  --  40  --   --  32  ALKPHOS 53  --  52  --   --  49  BILITOT 1.2  --  0.9  --   --  0.6  GFRNONAA >60 >60 >60  --   --  >60  ANIONGAP 9 12 13   --   --  10     Hematology Recent Labs  Lab 10/23/24 1811 10/26/24 0800 10/26/24 1206 10/26/24 1221 10/27/24 0247  WBC 6.0 5.0  --   --  5.1  RBC 4.00 4.00  --   --  4.08  HGB 12.8 12.6 11.6* 11.6*  11.9* 12.7  HCT 36.9 37.0 34.0* 34.0*  35.0* 38.1  MCV 92.3 92.5  --   --  93.4  MCH 32.0 31.5  --   --  31.1  MCHC 34.7 34.1  --   --  33.3  RDW 13.8 13.9  --   --  13.8  PLT 232 220  --   --  224    Cardiac EnzymesNo results for input(s): TROPONINI in the last 168 hours. No results for input(s): TROPIPOC in the last 168 hours.   BNP Recent Labs  Lab 10/23/24 0604  PROBNP 5,422.0*     DDimer  Recent Labs  Lab 10/23/24 0604  DDIMER 0.67*     Radiology    MR CARDIAC MORPHOLOGY W WO CONTRAST Result Date: 10/26/2024 CLINICAL DATA:  80F presents with new heart failure. EF <20% on echo. Cath with 75% proximal to mid LAD stenosis, 95% acute marginal EXAM: CARDIAC MRI TECHNIQUE: The patient was scanned on a 1.5 Tesla Siemens magnet. A dedicated cardiac coil was used. Functional  imaging was done using Fiesta sequences. 2,3, and 4 chamber views were done to assess for RWMA's. Modified Simpson's rule using a short axis stack was used to calculate an ejection fraction on a dedicated work [employer]. The patient received 8 cc of Gadavist. After 10 minutes inversion recovery sequences were used to assess for infiltration and scar tissue. Phase contrast velocity mapping was performed above the aortic and pulmonic valves CONTRAST:  8 cc  of Gadavist FINDINGS: Left ventricle: -Severe dilatation -No hypertrophy -Severe systolic dysfunction. Global hypokinesis with septal dyskinesis consistent with LBBB -Elevated ECV (35%) -Normal T2 values -Basal septal midwall LGE -RV insertion site LGE LV EF:  16% (Normal 52-79%) Absolute volumes: LV EDV: (Normal 78-167 mL) LV ESV: (Normal 21-64 mL) LV SV: 44mL (Normal 52-114 mL) CO: 3.3L/min (Normal 2.7-6.3 L/min) Indexed volumes: LV EDV: 157mL/sq-m (Normal 50-96 mL/sq-m) LV ESV: 182mL/sq-m (Normal 10-40 mL/sq-m) LV SV: 51mL/sq-m (Normal 33-64 mL/sq-m) CI: 2.2L/min/sq-m (Normal 1.9-3.9 L/min/sq-m) Right ventricle: Normal size with mild systolic dysfunction RV EF: 40% (Normal 52-80%) Absolute volumes: RV EDV: 99mL (Normal 79-175 mL) RV ESV: 59mL (Normal 13-75 mL) RV SV: 40mL (Normal 56-110 mL) CO: 3.0L/min (Normal 2.7-6 L/min) Indexed volumes: RV EDV: 62mL/sq-m (Normal 51-97 mL/sq-m) RV ESV: 70mL/sq-m (Normal 9-42 mL/sq-m) RV SV: 12mL/sq-m (Normal 35-61 mL/sq-m) CI: 2.0L/min/sq-m (Normal 1.8-3.8 L/min/sq-m) Left atrium: Normal size Right atrium: Normal size Mitral valve: Trivial regurgitation Aortic valve: Trivial regurgitation Tricuspid valve: Trivial regurgitation Pulmonic valve: Trivial regurgitation Aorta: Normal proximal ascending aorta Pericardium: Normal IMPRESSION: 1. Severe LV dilatation, no hypertrophy, and severe systolic dysfunction (EF 16%). Global hypokinesis with septal dyskinesis consistent with LBBB 2.  Normal RV  size with mild systolic dysfunction (EF 40%) 3. Basal septal midwall LGE, which is a scar  pattern seen in nonischemic cardiomyopathies and associated with worse prognosis 4. RV insertion site LGE, which is a nonspecific scar pattern often seen in setting of elevated pulmonary pressures Electronically Signed   By: Lonni Nanas M.D.   On: 10/26/2024 22:35   MR CARDIAC VELOCITY FLOW MAP Result Date: 10/26/2024 CLINICAL DATA:  35F presents with new heart failure. EF <20% on echo. Cath with 75% proximal to mid LAD stenosis, 95% acute marginal EXAM: CARDIAC MRI TECHNIQUE: The patient was scanned on a 1.5 Tesla Siemens magnet. A dedicated cardiac coil was used. Functional imaging was done using Fiesta sequences. 2,3, and 4 chamber views were done to assess for RWMA's. Modified Simpson's rule using a short axis stack was used to calculate an ejection fraction on a dedicated work [employer]. The patient received 8 cc of Gadavist. After 10 minutes inversion recovery sequences were used to assess for infiltration and scar tissue. Phase contrast velocity mapping was performed above the aortic and pulmonic valves CONTRAST:  8 cc  of Gadavist FINDINGS: Left ventricle: -Severe dilatation -No hypertrophy -Severe systolic dysfunction. Global hypokinesis with septal dyskinesis consistent with LBBB -Elevated ECV (35%) -Normal T2 values -Basal septal midwall LGE -RV insertion site LGE LV EF:  16% (Normal 52-79%) Absolute volumes: LV EDV: (Normal 78-167 mL) LV ESV: (Normal 21-64 mL) LV SV: 44mL (Normal 52-114 mL) CO: 3.3L/min (Normal 2.7-6.3 L/min) Indexed volumes: LV EDV: 131mL/sq-m (Normal 50-96 mL/sq-m) LV ESV: 118mL/sq-m (Normal 10-40 mL/sq-m) LV SV: 38mL/sq-m (Normal 33-64 mL/sq-m) CI: 2.2L/min/sq-m (Normal 1.9-3.9 L/min/sq-m) Right ventricle: Normal size with mild systolic dysfunction RV EF: 40% (Normal 52-80%) Absolute volumes: RV EDV: 99mL (Normal 79-175 mL) RV ESV: 59mL (Normal 13-75  mL) RV SV: 40mL (Normal 56-110 mL) CO: 3.0L/min (Normal 2.7-6 L/min) Indexed volumes: RV EDV: 14mL/sq-m (Normal 51-97 mL/sq-m) RV ESV: 39mL/sq-m (Normal 9-42 mL/sq-m) RV SV: 50mL/sq-m (Normal 35-61 mL/sq-m) CI: 2.0L/min/sq-m (Normal 1.8-3.8 L/min/sq-m) Left atrium: Normal size Right atrium: Normal size Mitral valve: Trivial regurgitation Aortic valve: Trivial regurgitation Tricuspid valve: Trivial regurgitation Pulmonic valve: Trivial regurgitation Aorta: Normal proximal ascending aorta Pericardium: Normal IMPRESSION: 1. Severe LV dilatation, no hypertrophy, and severe systolic dysfunction (EF 16%). Global hypokinesis with septal dyskinesis consistent with LBBB 2.  Normal RV size with mild systolic dysfunction (EF 40%) 3. Basal septal midwall LGE, which is a scar pattern seen in nonischemic cardiomyopathies and associated with worse prognosis 4. RV insertion site LGE, which is a nonspecific scar pattern often seen in setting of elevated pulmonary pressures Electronically Signed   By: Lonni Nanas M.D.   On: 10/26/2024 22:35   MR CARDIAC VELOCITY FLOW MAP Result Date: 10/26/2024 CLINICAL DATA:  35F presents with new heart failure. EF <20% on echo. Cath with 75% proximal to mid LAD stenosis, 95% acute marginal EXAM: CARDIAC MRI TECHNIQUE: The patient was scanned on a 1.5 Tesla Siemens magnet. A dedicated cardiac coil was used. Functional imaging was done using Fiesta sequences. 2,3, and 4 chamber views were done to assess for RWMA's. Modified Simpson's rule using a short axis stack was used to calculate an ejection fraction on a dedicated work [employer]. The patient received 8 cc of Gadavist. After 10 minutes inversion recovery sequences were used to assess for infiltration and scar tissue. Phase contrast velocity mapping was performed above the aortic and pulmonic valves CONTRAST:  8 cc  of Gadavist FINDINGS: Left ventricle: -Severe dilatation -No hypertrophy -Severe systolic  dysfunction. Global hypokinesis with septal dyskinesis consistent with  LBBB -Elevated ECV (35%) -Normal T2 values -Basal septal midwall LGE -RV insertion site LGE LV EF:  16% (Normal 52-79%) Absolute volumes: LV EDV: (Normal 78-167 mL) LV ESV: (Normal 21-64 mL) LV SV: 44mL (Normal 52-114 mL) CO: 3.3L/min (Normal 2.7-6.3 L/min) Indexed volumes: LV EDV: 13mL/sq-m (Normal 50-96 mL/sq-m) LV ESV: 169mL/sq-m (Normal 10-40 mL/sq-m) LV SV: 59mL/sq-m (Normal 33-64 mL/sq-m) CI: 2.2L/min/sq-m (Normal 1.9-3.9 L/min/sq-m) Right ventricle: Normal size with mild systolic dysfunction RV EF: 40% (Normal 52-80%) Absolute volumes: RV EDV: 99mL (Normal 79-175 mL) RV ESV: 59mL (Normal 13-75 mL) RV SV: 40mL (Normal 56-110 mL) CO: 3.0L/min (Normal 2.7-6 L/min) Indexed volumes: RV EDV: 57mL/sq-m (Normal 51-97 mL/sq-m) RV ESV: 89mL/sq-m (Normal 9-42 mL/sq-m) RV SV: 52mL/sq-m (Normal 35-61 mL/sq-m) CI: 2.0L/min/sq-m (Normal 1.8-3.8 L/min/sq-m) Left atrium: Normal size Right atrium: Normal size Mitral valve: Trivial regurgitation Aortic valve: Trivial regurgitation Tricuspid valve: Trivial regurgitation Pulmonic valve: Trivial regurgitation Aorta: Normal proximal ascending aorta Pericardium: Normal IMPRESSION: 1. Severe LV dilatation, no hypertrophy, and severe systolic dysfunction (EF 16%). Global hypokinesis with septal dyskinesis consistent with LBBB 2.  Normal RV size with mild systolic dysfunction (EF 40%) 3. Basal septal midwall LGE, which is a scar pattern seen in nonischemic cardiomyopathies and associated with worse prognosis 4. RV insertion site LGE, which is a nonspecific scar pattern often seen in setting of elevated pulmonary pressures Electronically Signed   By: Lonni Nanas M.D.   On: 10/26/2024 22:35   CARDIAC CATHETERIZATION Result Date: 10/26/2024   Prox LAD to Mid LAD lesion is 75% stenosed.   Acute Mrg lesion is 95% stenosed.   Prox RCA lesion is 30% stenosed.   Mid LAD lesion is 60% stenosed.    2nd Diag lesion is 50% stenosed.   The left ventricular ejection fraction is less than 25% by visual estimate. Findings: Ao = 96/68 (82) LV = 104/21 RA =  5 RV = 23/8 PA = 22/11 (17) PCW = 11 Fick cardiac output/index = 3.8/2.5 PVR = 1.6 WU Ao sat = 97% PA sat = 66%, 66% PAPi = 2.2 Assessment: 1. 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 Severe LV dysfunction with EF < 20% 3. 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 Lesion in prox to mid LAD is borderline significant and heavily calcified but in the absence of ACS or progressive angina, I think it is best to treat this medically at this time and focus on medical management (can pursue PCI if this changes.)  D/w IC team. Toribio Fuel, MD 1:06 PM   Cardiac Studies  [facility] 10/26/24:   Prox LAD to Mid LAD lesion is 75% stenosed.   Acute Mrg lesion is 95% stenosed.   Prox RCA lesion is 30% stenosed.   Mid LAD lesion is 60% stenosed.   2nd Diag lesion is 50% stenosed.   The left ventricular ejection fraction is less than 25% by visual estimate.   Findings:   Ao = 96/68 (82) LV = 104/21 RA =  5 RV = 23/8 PA = 22/11 (17) PCW = 11 Fick cardiac output/index = 3.8/2.5 PVR = 1.6 WU Ao sat = 97% PA sat = 66%, 66% PAPi = 2.2  Echo 10/24/24:  1. Left ventricular ejection fraction, by estimation, is <20%. The left  ventricle has severely decreased function. The left ventricle demonstrates  global hypokinesis. The left ventricular internal cavity size was severely  dilated. Indeterminate  diastolic filling due to E-A fusion. The average left ventricular global  longitudinal  strain is -6.9 %. The global longitudinal strain is abnormal.   2. Right ventricular systolic function is normal. The right ventricular  size is normal. There is normal pulmonary artery systolic pressure. The  estimated right ventricular systolic pressure is 27.2 mmHg.   3. Left atrial size was mildly dilated.   4. The  mitral valve is normal in structure. Mild to moderate mitral valve  regurgitation. No evidence of mitral stenosis.   5. The aortic valve is tricuspid. Aortic valve regurgitation is not  visualized. No aortic stenosis is present.   6. The inferior vena cava is normal in size with greater than 50%  respiratory variability, suggesting right atrial pressure of 3 mmHg.    Cardiac MRI 10/26/24: 1. Severe LV dilatation, no hypertrophy, and severe systolic dysfunction (EF 16%). Global hypokinesis with septal dyskinesis consistent with LBBB 2.  Normal RV size with mild systolic dysfunction (EF 40%) 3. Basal septal midwall LGE, which is a scar pattern seen in nonischemic cardiomyopathies and associated with worse prognosis 4. RV insertion site LGE, which is a nonspecific scar pattern often seen in setting of elevated pulmonary pressures  Patient Profile     78 y.o. female with a history of remote tobacco use, hypertension who presented to [facility] with 1 week of intermittent chest pain and shortness of breath and diagnosed with new onset HFrEF with EF less than 20% and severe dilatation and global hypokinesis, prompting cardiology consult on 10/25/2024.  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 underwent L/RHC and CMR this hospitalization with results above.   Has been diuresed with 9 kg weight loss.  No labs today. GDMT: spironolactone 25 mg, Entresto 24-26 mg BID. Will hold off on advancing GDMT as she is feeling dizzy right now and just took her AM meds at the same time 1 hour ago so it is likely all taking effect. BP is normotensive. She was advised to stagger her AM Aldactone and Entresto by about an hour to avoid this. Will not prescribe diuretics at this time as she has remained euvolemic for several days without diuresis and is started on Entresto and aldactone. Will have the patient follow up in the office and establish  with the heart failure clinic in 1 week to ensure GDMT optimization and likely CRT placement if she has EF < 35% and persistent LBBB with QRS > 150 ms after repeat echo in 3 months. Advised on salt restriction, fluid restriction and monitor for HF signs/symptoms. She was advised to call the office or go to the ED if she notices the HF symptoms.  LBBB - QRS >150 ms.  CAD -  LHC 10/26/24:   Prox LAD to Mid LAD lesion is 75% stenosed.   Acute Mrg lesion is 95% stenosed.   Prox RCA lesion is 30% stenosed.   Mid LAD lesion is 60% stenosed.   2nd Diag lesion is 50% stenosed. - Continue aspirin 81 mg - start rosuvastatin 20 mg - lipid panel - hold off on PCI for now (likely given results of the REVISED BCIS2 Trial) Hypertension Remote Tobacco use   Will have the patient establish with the HF clinic in 1 week post hospitalization with a BMP and general cardio in 3 months. No further recommendations. Cardiology will sign off. Please do not hesitate to reach back out with any further questions.     For questions or updates, please contact [facility] Please consult  [email or web address] for contact info under    Time coordinating care: 52 minutes    Signed, Emeline Calender, DO 10/27/2024, 8:30 AM

## 2024-10-27 NOTE — Telephone Encounter (Signed)
 Pharmacy Patient Advocate Encounter  Insurance verification completed.    The patient is insured through [employer]. Patient has Medicare and is not eligible for a copay card, but may be able to apply for patient assistance or Medicare RX Payment Plan (Patient Must reach out to their plan, if eligible for payment plan), if available.    Ran test claim for Jardiance 10mg  tablets and the current 30 day co-pay is $12.15.  Ran test claim for Farxiga 10mg  tablets and the current 30 day co-pay is $12.15.   This test claim was processed through [facility]- copay amounts may vary at other pharmacies due to pharmacy/plan contracts, or as the patient moves through the different stages of their insurance plan.

## 2024-10-27 NOTE — Care Management Important Message (Signed)
 Important Message  Patient Details  Name: Jessica Davis MRN: [id number] Date of Birth: 02-May-1946   Important Message Given:  Yes - Medicare IM     [name] 10/27/2024, 11:41 AM

## 2024-10-27 NOTE — TOC CM/SW Note (Signed)
 Transition of Care [facility]) - Inpatient Brief Assessment   Patient Details  Name: Jessica Davis MRN: [id number] Date of Birth: 1946/08/02  Transition of Care [facility]) CM/SW Contact:    Waddell Barnie Rama, RN Phone Number: 10/27/2024, 12:31 PM   Clinical Narrative: From home alone, has  no PCP and insurance on file, states has no HH services in place at this time or DME at home.  States family member will transport them home at [employer] and family is support system,   Pta self ambulatory.       Transition of Care Asessment: Insurance and Status: Insurance coverage has been reviewed Patient has primary care physician: Yes Home environment has been reviewed: home alone Prior level of function:: indep Prior/Current Home Services: No current home services Social Drivers of Health Review: SDOH reviewed no interventions necessary Readmission risk has been reviewed: Yes Transition of care needs: transition of care needs identified, TOC will continue to follow

## 2024-10-27 NOTE — Progress Notes (Signed)
 Heart Failure Stewardship Pharmacist Progress Note   PCP: Patient, No Pcp Per PCP-Cardiologist: Lonni LITTIE Nanas, MD    HPI:  78 yo F with PMH of HTN and chronic LBBB that has been documented since 2014.   Presented to the ED with shortness of breath, chest pain, orthopnea, and PND. proBNP elevated. CXR with interstitial edema and trace bilateral pleural effusions. ECHO on 11/15 with LVEF <20%, global hypokinesis, RV normal, mild to moderate MR. Underwent R/LHC on 11/17 and found to have 2 vessel nonobstructive CAD with borderline lesion in mLAD, RA 5, PA 17, wedge 11, CO 3.75, CI 2.5, PAPi 2.2. Suspect NICM/LBBB cardiomyopathy. cMRI on 11/17 showed LVEF 16%, global hypokinesis with septal dyskinesis consistent with LBBB, normal RV size with mild systolic dysfunction (EF 40%), basal septal midwall LGE and RV insertion site LGE.   Feels better. Denies shortness of breath. No LE edema. States she had some lightheadedness and dizziness about an hour after taking medications. Thinks its because she took her medications on an empty stomach. BP ok. Has a BP machine at home but it goes around the wrist. Advised that upper arm is more accurate and to look into purchasing a new device from the pharmacy. States she takes sudafed once weekly. Advised to stop this moving forward. Agreeable to using [facility] TOC pharmacy at discharge.    Current HF Medications: ACE/ARB/ARNI: Entresto 24/26 mg BID MRA: spironolactone 25 mg daily  Prior to admission HF Medications: None  Pertinent Lab Values: Serum creatinine 0.85, BUN 22, Potassium 4.2, Sodium 131, proBNP 5422, Magnesium 2.1   Vital Signs: Weight: 111 lbs (admission weight: 112 lbs) Blood pressure: 110/60s  Heart rate: 60-80s  I/O: incomplete  Medication Assistance / Insurance Benefits Check: Does the patient have prescription insurance?  Yes Type of insurance plan: [employer] medicare  Outpatient Pharmacy:  Prior to admission  outpatient pharmacy: [facility] Is the patient willing to use [facility] TOC pharmacy at discharge? Yes Is the patient willing to transition their outpatient pharmacy to utilize a [facility] outpatient pharmacy?   No    Assessment: 1. Acute systolic CHF (LVEF 16%), due to NICM/LBBB. NYHA class II symptoms. - Off IV lasix. Does not appear volume overloaded on exam. RHC with RA 5, PA 17, wedge 11.  - Consider BB at follow up. She is still symptomatic at this time with her new medications.  - Continue Entresto 24/26 mg BID - Continue spironolactone 25 mg daily. Will try taking spiro around noon once she is home to see if this helps prevent symptoms of lightheadedness - Consider adding SGLT2i prior to discharge vs at follow up  Plan: 1) Medication changes recommended at this time: - May be able to add SGLT2i but patient may be resistant since she is having lightheadedness and dizziness on new medications.  2) Patient assistance: - Entresto copay $4.90 - Farxiga/Jardiance copay $12.15 - Patient states these copays are affordable - Reports she has partial medicaid and would like to see if she can expand to full medicaid - messaged Barnie, RNCM to connect her with financial counselors  3)  Education  - Patient has been educated on current HF medications and potential additions to HF medication regimen - Patient verbalizes understanding that over the next few months, these medication doses may change and more medications may be added to optimize HF regimen - Patient has been educated on basic disease state pathophysiology and goals of therapy   Duwaine Plant, PharmD, BCPS Heart Failure Stewardship  Pharmacist Phone [phone]

## 2024-10-27 NOTE — TOC Transition Note (Signed)
 Transition of Care [facility]) - Discharge Note   Patient Details  Name: Jessica Davis MRN: [id number] Date of Birth: 12/29/1945  [facility]) CM/SW Contact:  Waddell Barnie Rama, RN Phone Number: 10/27/2024, 12:33 PM   Clinical Narrative:    For dc today, NCM scheduled hospital follow with [facility] on [facility].          Patient Goals and CMS Choice            Discharge Placement                       Discharge Plan and Services Additional resources added to the After Visit Summary for                                       Social Drivers of Health (SDOH) Interventions SDOH Screenings   Food Insecurity: No Food Insecurity (10/23/2024)  Housing: Unknown (10/27/2024)  Transportation Needs: No Transportation Needs (10/27/2024)  Recent Concern: Transportation Needs - Unmet Transportation Needs (10/23/2024)  Utilities: Not At Risk (10/23/2024)  Alcohol Screen: Low Risk  (10/27/2024)  Financial Resource Strain: Low Risk  (10/27/2024)  Social Connections: Patient Declined (10/23/2024)  Tobacco Use: Medium Risk (10/23/2024)     Readmission Risk Interventions    10/27/2024   12:30 PM  Readmission Risk Prevention Plan  Medication Screening Complete  Transportation Screening Complete

## 2024-10-29 NOTE — Discharge Summary (Signed)
 Physician Discharge Summary   Patient: Jessica Davis MRN: [id number] DOB: 03-28-1946  Admit date:     10/23/2024  Discharge date: 10/27/2024  Discharge Physician: Yetta Blanch  PCP: Patient, No Pcp Per  Recommendations at discharge: Follow-up with cardiology as recommended. Follow-up with PCP in 1 week with repeat BMP.   Follow-up Information     [facility]. Go in 6 day(s).   Specialty: Cardiology Why: Hospital follow up 11/02/2024 @ 10:30 am PLEASE bring a current medication list to appointment FREE valet parking, Entrance C, off [facility] [facility] and [facility] entrance Contact information: [address] [place]  [phone]        [facility] Follow up on 11/11/2024.   Why: @11 :45 please arrive 11:30am Contact information: [address] [place]  [address] [phone]               Hospital Course: 78/F history of hypertension, now diet controlled presented to the ED with progressive dyspnea on exertion, intermittent chest discomfort for over 1 week, along with PND and orthopnea. -Labs with BNP 5422, troponin 17, creatinine one 0.6, sodium 133, chest x-ray with interstitial edema and pleural effusions.  Cardiology was consulted.   Assessment and Plan: Acute systolic CHF Echocardiogram shows EF less than 20%. Cardiology consulted. Volume status appears to be improving. Underwent right and left heart catheterization on 11/17. Has two-vessel CAD.  Nonobstructive. Medical management recommended. Hemodynamics appears to be well compensated as well. For now no diuresis.  GDMT limited due to symptoms of dizziness.  Continue Entresto and Aldactone.   Hypertension Blood pressure improving, diuretics as above, continue Aldactone. Now on Entresto.  GERD. On PPI.  HLD. On Crestor.  Insomnia Continue melatonin.  Consultants:   Cardiology   Procedures performed:  Right and left heart catheterization. Echocardiogram. Cardiac MRI  DISCHARGE MEDICATION: Allergies as of 10/27/2024       Reactions   Fluticasone Propionate    Years ago. Reaction was unknown        Medication List     TAKE these medications    aspirin EC 81 MG tablet Take 1 tablet (81 mg total) by mouth daily. Swallow whole.   CALCIUM-VITAMIN D PO Take 1 tablet by mouth daily.   MAGNESIUM PO Take 1 tablet by mouth daily.   multivitamin with minerals Tabs tablet Take 1 tablet by mouth daily.   pantoprazole 40 MG tablet Commonly known as: PROTONIX Take 1 tablet (40 mg total) by mouth daily at 12 noon.   rosuvastatin 20 MG tablet Commonly known as: CRESTOR Take 1 tablet (20 mg total) by mouth daily.   sacubitril-valsartan 24-26 MG Commonly known as: ENTRESTO Take 1 tablet by mouth 2 (two) times daily.   spironolactone 25 MG tablet Commonly known as: ALDACTONE Take 1 tablet (25 mg total) by mouth daily.   VITAMIN C PO Take 1 tablet by mouth daily.   Vitamin D 1.25 MG (50000 UT) Caps Take 1,000 Units by mouth once a week.       Disposition: Home Diet recommendation: Cardiac diet  Discharge Exam: Vitals:   10/27/24 0400 10/27/24 0500 10/27/24 0713 10/27/24 0917  BP: 106/65   112/78  Pulse: 73  88   Resp: 18  18   Temp: 97.9 F (36.6 C)  97.8 F (36.6 C)   TempSrc: Oral  Oral   SpO2: 96%  96%   Weight:  50.4 kg  Height:       Clear to auscultation. S1-S2 present Bowel sounds present No edema.  Filed Weights   10/25/24 0546 10/26/24 0636 10/27/24 0500  Weight: 50.6 kg 50.3 kg 50.4 kg   Condition at discharge: stable  The results of significant diagnostics from this hospitalization (including imaging, microbiology, ancillary and laboratory) are listed below for reference.   Imaging Studies: MR CARDIAC MORPHOLOGY W WO CONTRAST Result Date: 10/26/2024 CLINICAL DATA:  66F presents with new heart  failure. EF <20% on echo. Cath with 75% proximal to mid LAD stenosis, 95% acute marginal EXAM: CARDIAC MRI TECHNIQUE: The patient was scanned on a 1.5 Tesla Siemens magnet. A dedicated cardiac coil was used. Functional imaging was done using Fiesta sequences. 2,3, and 4 chamber views were done to assess for RWMA's. Modified Simpson's rule using a short axis stack was used to calculate an ejection fraction on a dedicated work [employer]. The patient received 8 cc of Gadavist. After 10 minutes inversion recovery sequences were used to assess for infiltration and scar tissue. Phase contrast velocity mapping was performed above the aortic and pulmonic valves CONTRAST:  8 cc  of Gadavist FINDINGS: Left ventricle: -Severe dilatation -No hypertrophy -Severe systolic dysfunction. Global hypokinesis with septal dyskinesis consistent with LBBB -Elevated ECV (35%) -Normal T2 values -Basal septal midwall LGE -RV insertion site LGE LV EF:  16% (Normal 52-79%) Absolute volumes: LV EDV: (Normal 78-167 mL) LV ESV: (Normal 21-64 mL) LV SV: 44mL (Normal 52-114 mL) CO: 3.3L/min (Normal 2.7-6.3 L/min) Indexed volumes: LV EDV: 180mL/sq-m (Normal 50-96 mL/sq-m) LV ESV: 122mL/sq-m (Normal 10-40 mL/sq-m) LV SV: 61mL/sq-m (Normal 33-64 mL/sq-m) CI: 2.2L/min/sq-m (Normal 1.9-3.9 L/min/sq-m) Right ventricle: Normal size with mild systolic dysfunction RV EF: 40% (Normal 52-80%) Absolute volumes: RV EDV: 99mL (Normal 79-175 mL) RV ESV: 59mL (Normal 13-75 mL) RV SV: 40mL (Normal 56-110 mL) CO: 3.0L/min (Normal 2.7-6 L/min) Indexed volumes: RV EDV: 93mL/sq-m (Normal 51-97 mL/sq-m) RV ESV: 68mL/sq-m (Normal 9-42 mL/sq-m) RV SV: 89mL/sq-m (Normal 35-61 mL/sq-m) CI: 2.0L/min/sq-m (Normal 1.8-3.8 L/min/sq-m) Left atrium: Normal size Right atrium: Normal size Mitral valve: Trivial regurgitation Aortic valve: Trivial regurgitation Tricuspid valve: Trivial regurgitation Pulmonic valve: Trivial regurgitation Aorta: Normal  proximal ascending aorta Pericardium: Normal IMPRESSION: 1. Severe LV dilatation, no hypertrophy, and severe systolic dysfunction (EF 16%). Global hypokinesis with septal dyskinesis consistent with LBBB 2.  Normal RV size with mild systolic dysfunction (EF 40%) 3. Basal septal midwall LGE, which is a scar pattern seen in nonischemic cardiomyopathies and associated with worse prognosis 4. RV insertion site LGE, which is a nonspecific scar pattern often seen in setting of elevated pulmonary pressures Electronically Signed   By: Lonni Nanas M.D.   On: 10/26/2024 22:35   MR CARDIAC VELOCITY FLOW MAP Result Date: 10/26/2024 CLINICAL DATA:  66F presents with new heart failure. EF <20% on echo. Cath with 75% proximal to mid LAD stenosis, 95% acute marginal EXAM: CARDIAC MRI TECHNIQUE: The patient was scanned on a 1.5 Tesla Siemens magnet. A dedicated cardiac coil was used. Functional imaging was done using Fiesta sequences. 2,3, and 4 chamber views were done to assess for RWMA's. Modified Simpson's rule using a short axis stack was used to calculate an ejection fraction on a dedicated work [employer]. The patient received 8 cc of Gadavist. After 10 minutes inversion recovery sequences were used to assess for infiltration and scar tissue. Phase contrast velocity mapping was performed above the aortic and pulmonic valves CONTRAST:  8 cc  of Gadavist FINDINGS: Left ventricle: -Severe dilatation -No hypertrophy -Severe systolic dysfunction. Global hypokinesis with septal dyskinesis consistent with LBBB -Elevated ECV (35%) -Normal T2 values -Basal septal midwall LGE -RV insertion site LGE LV EF:  16% (Normal 52-79%) Absolute volumes: LV EDV: (Normal 78-167 mL) LV ESV: (Normal 21-64 mL) LV SV: 44mL (Normal 52-114 mL) CO: 3.3L/min (Normal 2.7-6.3 L/min) Indexed volumes: LV EDV: 124mL/sq-m (Normal 50-96 mL/sq-m) LV ESV: 143mL/sq-m (Normal 10-40 mL/sq-m) LV SV: 4mL/sq-m (Normal 33-64  mL/sq-m) CI: 2.2L/min/sq-m (Normal 1.9-3.9 L/min/sq-m) Right ventricle: Normal size with mild systolic dysfunction RV EF: 40% (Normal 52-80%) Absolute volumes: RV EDV: 99mL (Normal 79-175 mL) RV ESV: 59mL (Normal 13-75 mL) RV SV: 40mL (Normal 56-110 mL) CO: 3.0L/min (Normal 2.7-6 L/min) Indexed volumes: RV EDV: 75mL/sq-m (Normal 51-97 mL/sq-m) RV ESV: 43mL/sq-m (Normal 9-42 mL/sq-m) RV SV: 5mL/sq-m (Normal 35-61 mL/sq-m) CI: 2.0L/min/sq-m (Normal 1.8-3.8 L/min/sq-m) Left atrium: Normal size Right atrium: Normal size Mitral valve: Trivial regurgitation Aortic valve: Trivial regurgitation Tricuspid valve: Trivial regurgitation Pulmonic valve: Trivial regurgitation Aorta: Normal proximal ascending aorta Pericardium: Normal IMPRESSION: 1. Severe LV dilatation, no hypertrophy, and severe systolic dysfunction (EF 16%). Global hypokinesis with septal dyskinesis consistent with LBBB 2.  Normal RV size with mild systolic dysfunction (EF 40%) 3. Basal septal midwall LGE, which is a scar pattern seen in nonischemic cardiomyopathies and associated with worse prognosis 4. RV insertion site LGE, which is a nonspecific scar pattern often seen in setting of elevated pulmonary pressures Electronically Signed   By: Lonni Nanas M.D.   On: 10/26/2024 22:35   MR CARDIAC VELOCITY FLOW MAP Result Date: 10/26/2024 CLINICAL DATA:  89F presents with new heart failure. EF <20% on echo. Cath with 75% proximal to mid LAD stenosis, 95% acute marginal EXAM: CARDIAC MRI TECHNIQUE: The patient was scanned on a 1.5 Tesla Siemens magnet. A dedicated cardiac coil was used. Functional imaging was done using Fiesta sequences. 2,3, and 4 chamber views were done to assess for RWMA's. Modified Simpson's rule using a short axis stack was used to calculate an ejection fraction on a dedicated work [employer]. The patient received 8 cc of Gadavist. After 10 minutes inversion recovery sequences were used to assess for  infiltration and scar tissue. Phase contrast velocity mapping was performed above the aortic and pulmonic valves CONTRAST:  8 cc  of Gadavist FINDINGS: Left ventricle: -Severe dilatation -No hypertrophy -Severe systolic dysfunction. Global hypokinesis with septal dyskinesis consistent with LBBB -Elevated ECV (35%) -Normal T2 values -Basal septal midwall LGE -RV insertion site LGE LV EF:  16% (Normal 52-79%) Absolute volumes: LV EDV: (Normal 78-167 mL) LV ESV: (Normal 21-64 mL) LV SV: 44mL (Normal 52-114 mL) CO: 3.3L/min (Normal 2.7-6.3 L/min) Indexed volumes: LV EDV: 117mL/sq-m (Normal 50-96 mL/sq-m) LV ESV: 166mL/sq-m (Normal 10-40 mL/sq-m) LV SV: 4mL/sq-m (Normal 33-64 mL/sq-m) CI: 2.2L/min/sq-m (Normal 1.9-3.9 L/min/sq-m) Right ventricle: Normal size with mild systolic dysfunction RV EF: 40% (Normal 52-80%) Absolute volumes: RV EDV: 99mL (Normal 79-175 mL) RV ESV: 59mL (Normal 13-75 mL) RV SV: 40mL (Normal 56-110 mL) CO: 3.0L/min (Normal 2.7-6 L/min) Indexed volumes: RV EDV: 30mL/sq-m (Normal 51-97 mL/sq-m) RV ESV: 27mL/sq-m (Normal 9-42 mL/sq-m) RV SV: 47mL/sq-m (Normal 35-61 mL/sq-m) CI: 2.0L/min/sq-m (Normal 1.8-3.8 L/min/sq-m) Left atrium: Normal size Right atrium: Normal size Mitral valve: Trivial regurgitation Aortic valve: Trivial regurgitation Tricuspid valve: Trivial regurgitation Pulmonic valve: Trivial regurgitation Aorta: Normal proximal ascending aorta Pericardium: Normal IMPRESSION: 1. Severe LV dilatation, no hypertrophy, and severe  systolic dysfunction (EF 16%). Global hypokinesis with septal dyskinesis consistent with LBBB 2.  Normal RV size with mild systolic dysfunction (EF 40%) 3. Basal septal midwall LGE, which is a scar pattern seen in nonischemic cardiomyopathies and associated with worse prognosis 4. RV insertion site LGE, which is a nonspecific scar pattern often seen in setting of elevated pulmonary pressures Electronically Signed   By: Lonni Nanas M.D.   On:  10/26/2024 22:35   CARDIAC CATHETERIZATION Result Date: 10/26/2024   Prox LAD to Mid LAD lesion is 75% stenosed.   Acute Mrg lesion is 95% stenosed.   Prox RCA lesion is 30% stenosed.   Mid LAD lesion is 60% stenosed.   2nd Diag lesion is 50% stenosed.   The left ventricular ejection fraction is less than 25% by visual estimate. Findings: Ao = 96/68 (82) LV = 104/21 RA =  5 RV = 23/8 PA = 22/11 (17) PCW = 11 Fick cardiac output/index = 3.8/2.5 PVR = 1.6 WU Ao sat = 97% PA sat = 66%, 66% PAPi = 2.2 Assessment: 1. 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 Severe LV dysfunction with EF < 20% 3. 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 Lesion in prox to mid LAD is borderline significant and heavily calcified but in the absence of ACS or progressive angina, I think it is best to treat this medically at this time and focus on medical management (can pursue PCI if this changes.)  D/w IC team. Toribio Fuel, MD 1:06 PM  ECHOCARDIOGRAM COMPLETE Result Date: 10/24/2024    ECHOCARDIOGRAM REPORT   Patient Name:   MARSHAYLA MITSCHKE Date of Exam: 10/24/2024 Medical Rec #:  [id number]     Height:       62.0 in Accession #:    [id number]    Weight:       112.0 lb Date of Birth:  October 26, 1946     BSA:          1.494 m Patient Age:    78 years      BP:           130/81 mmHg Patient Gender: F             HR:           80 bpm. Exam Location:  Inpatient Procedure: 2D Echo and Intracardiac Opacification Agent (Both Spectral and Color            Flow Doppler were utilized during procedure). Indications:    CHF  History:        Patient has no prior history of Echocardiogram examinations.                 CHF.  Sonographer:    Norleen Amour Referring Phys: DONALDA HERO GHIMIRE IMPRESSIONS  1. Left ventricular ejection fraction, by estimation, is <20%. The left ventricle has severely decreased function. The left ventricle demonstrates global hypokinesis. The left ventricular internal cavity  size was severely dilated. Indeterminate diastolic filling due to E-A fusion. The average left ventricular global longitudinal strain is -6.9 %. The global longitudinal strain is abnormal.  2. Right ventricular systolic function is normal. The right ventricular size is normal. There is normal pulmonary artery systolic pressure. The estimated right ventricular systolic pressure is 27.2 mmHg.  3. Left atrial size was mildly dilated.  4. The mitral valve is normal in structure. Mild to moderate mitral valve regurgitation. No evidence of mitral stenosis.  5. The aortic valve is  tricuspid. Aortic valve regurgitation is not visualized. No aortic stenosis is present.  6. The inferior vena cava is normal in size with greater than 50% respiratory variability, suggesting right atrial pressure of 3 mmHg. FINDINGS  Left Ventricle: Left ventricular ejection fraction, by estimation, is <20%. The left ventricle has severely decreased function. The left ventricle demonstrates global hypokinesis. The average left ventricular global longitudinal strain is -6.9 %. Strain  was performed and the global longitudinal strain is abnormal. The left ventricular internal cavity size was severely dilated. There is no left ventricular hypertrophy. Indeterminate diastolic filling due to E-A fusion. Right Ventricle: The right ventricular size is normal. No increase in right ventricular wall thickness. Right ventricular systolic function is normal. There is normal pulmonary artery systolic pressure. The tricuspid regurgitant velocity is 2.46 m/s, and  with an assumed right atrial pressure of 3 mmHg, the estimated right ventricular systolic pressure is 27.2 mmHg. Left Atrium: Left atrial size was mildly dilated. Right Atrium: Right atrial size was normal in size. Pericardium: There is no evidence of pericardial effusion. Mitral Valve: The mitral valve is normal in structure. Mild to moderate mitral valve regurgitation. No evidence of mitral valve  stenosis. MV peak gradient, 4.2 mmHg. The mean mitral valve gradient is 2.0 mmHg. Tricuspid Valve: The tricuspid valve is normal in structure. Tricuspid valve regurgitation is trivial. Aortic Valve: The aortic valve is tricuspid. Aortic valve regurgitation is not visualized. No aortic stenosis is present. Pulmonic Valve: The pulmonic valve was not well visualized. Pulmonic valve regurgitation is not visualized. Aorta: The aortic root and ascending aorta are structurally normal, with no evidence of dilitation. Venous: The inferior vena cava is normal in size with greater than 50% respiratory variability, suggesting right atrial pressure of 3 mmHg. IAS/Shunts: The interatrial septum was not well visualized.  LEFT VENTRICLE PLAX 2D LVIDd:         5.00 cm      Diastology LVIDs:         3.30 cm      LV e' medial:  5.11 cm/s LV PW:         0.90 cm      LV e' lateral: 20.70 cm/s LV IVS:        0.70 cm LVOT diam:     1.80 cm      2D Longitudinal Strain LV SV:         30           2D Strain GLS Avg:     -6.9 % LV SV Index:   20 LVOT Area:     2.54 cm  LV Volumes (MOD) LV vol d, MOD A2C: 214.0 ml LV vol d, MOD A4C: 196.0 ml LV vol s, MOD A2C: 166.0 ml LV vol s, MOD A4C: 188.0 ml LV SV MOD A2C:     48.0 ml LV SV MOD A4C:     196.0 ml LV SV MOD BP:      29.0 ml RIGHT VENTRICLE             IVC RV Basal diam:  2.60 cm     IVC diam: 1.20 cm RV S prime:     13.80 cm/s TAPSE (M-mode): 2.2 cm      PULMONARY VEINS                             Diastolic Velocity: 29.80 cm/s  S/D Velocity:       1.20                             Systolic Velocity:  34.60 cm/s LEFT ATRIUM             Index        RIGHT ATRIUM           Index LA diam:        4.10 cm 2.74 cm/m   RA Area:     11.00 cm LA Vol (A2C):   65.4 ml 43.77 ml/m  RA Volume:   21.20 ml  14.19 ml/m LA Vol (A4C):   51.2 ml 34.27 ml/m LA Biplane Vol: 58.8 ml 39.35 ml/m  AORTIC VALVE LVOT Vmax:   67.20 cm/s LVOT Vmean:  46.000 cm/s LVOT VTI:    0.116 m   AORTA Ao Root diam: 3.40 cm Ao Asc diam:  3.20 cm MITRAL VALVE             TRICUSPID VALVE MV Area VTI:  1.66 cm   TR Peak grad:   24.2 mmHg MV Peak grad: 4.2 mmHg   TR Vmax:        246.00 cm/s MV Mean grad: 2.0 mmHg MV Vmax:      1.03 m/s   SHUNTS MV Vmean:     67.0 cm/s  Systemic VTI:  0.12 m                          Systemic Diam: 1.80 cm Lonni Nanas MD Electronically signed by Lonni Nanas MD Signature Date/Time: 10/24/2024/4:07:26 PM    Final    DG Chest 2 View Result Date: 10/23/2024 EXAM: PA AND LATERAL (2) VIEW(S) XRAY OF THE CHEST 10/23/2024 06:31:16 AM COMPARISON: PA and lateral radiographs of the chest dated 09/22/2018 past history report. CLINICAL HISTORY: chest pain FINDINGS: LUNGS AND PLEURA: Since prior study, the patient has developed coarse interstitial disease with thickening of the interlobular septae suggesting interstitial edema. Trace bilateral pleural effusions. No pneumothorax. HEART AND MEDIASTINUM: Cardiomegaly. Aortic calcification. BONES AND SOFT TISSUES: No acute osseous abnormality. IMPRESSION: 1. Coarse interstitial disease with thickening of the interlobular septae, suggesting interstitial edema, developed since prior study. 2. Trace bilateral pleural effusions. 3. Cardiomegaly and aortic calcification. Electronically signed by: Evalene Coho MD 10/23/2024 06:40 AM EST RP Workstation: [id number]    Microbiology: No results found for this or any previous visit. Labs: CBC: Recent Labs  Lab 10/23/24 0604 10/23/24 1811 10/26/24 0800 10/26/24 1206 10/26/24 1221 10/27/24 0247  WBC 5.2 6.0 5.0  --   --  5.1  HGB 12.4 12.8 12.6 11.6* 11.6*  11.9* 12.7  HCT 36.6 36.9 37.0 34.0* 34.0*  35.0* 38.1  MCV 93.8 92.3 92.5  --   --  93.4  PLT 211 232 220  --   --  224   Basic Metabolic Panel: Recent Labs  Lab 10/23/24 0604 10/23/24 1811 10/24/24 0205 10/25/24 0207 10/26/24 0800 10/26/24 1206 10/26/24 1221 10/27/24 0247  NA 133*  --  131* 132*  133* 138 135  133* 131*  K 4.2  --  3.7 3.3* 4.4 3.6 3.7  4.0 4.2  CL 96*  --  94* 94* 98  --   --  99  CO2 25  --  28 26 22   --   --  22  GLUCOSE 102*  --  106*  91 92  --   --  97  BUN 16  --  16 21 20   --   --  22  CREATININE 0.66 0.83 0.78 0.75 0.73  --   --  0.85  CALCIUM  9.9  --  9.0 8.9 9.3  --   --  8.9  MG  --   --   --   --  2.1  --   --   --    Liver Function Tests: Recent Labs  Lab 10/23/24 0604 10/24/24 0205 10/26/24 0800 10/27/24 0247  AST 70* 42* 29 26  ALT 101* 61* 40 32  ALKPHOS 72 53 52 49  BILITOT 0.7 1.2 0.9 0.6  PROT 7.5 6.7 6.8 6.3*  ALBUMIN 4.4 3.8 3.7 3.4*   CBG: Recent Labs  Lab 10/23/24 2306  GLUCAP 109*    Discharge time spent: greater than 30 minutes.  Author: Yetta Blanch, MD  [facility] 10/27/2024

## 2024-10-30 ENCOUNTER — Telehealth (HOSPITAL_COMMUNITY): Payer: Self-pay

## 2024-10-30 NOTE — Telephone Encounter (Signed)
 Called to confirm/remind patient of their appointment [facility] on 11/02/2024.   Appointment:   [x] Confirmed  [] Left mess   [] No answer/No voice mail  [] VM Full/unable to leave message  [] Phone not in service  Patient reminded to bring all medications and/or complete list.  Confirmed patient has transportation. Gave directions, instructed to utilize valet parking.

## 2024-11-01 NOTE — Progress Notes (Signed)
 HEART & VASCULAR TRANSITION OF CARE CONSULT NOTE    Referring Physician: Dr. Tobie PCP: Patient, No Pcp Per   Chief Complaint: HFrEF  HPI: Referred to clinic by Dr. Tobie for heart failure consultation.   Jessica Davis is a 78 y.o. female with HFrEF, HTN and hx of  tobacco abuse.   Admitted 11/25 with intermittent chest pain and SOB. Echo showed EF <20%, LV with GHK, RV normal, LA mildly dilated, mild-mod MR. LHC 10/26/24 Prox LAD to Mid LAD lesion is 75% stenosed. Acute Mrg lesion is 95% stenosed. Prox RCA lesion is 30% stenosed. Mid LAD lesion is 60% stenosed. 2nd Diag lesion is 50% stenosed. Medical mgmt. RHC with relatively normal filling pressures and stable CO/CI. cMRI with severe LV dilatation, LVEF 16%, GHK, septal dyskinesis consistent with LBBB. 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 Diuresed with IV lasix . GDMT was limited with dizziness/hypotension. Discharged with spiro and Entresto .   Today she presents for AHF [facility] clinic visit. Overall feeling ok just limited sometimes with dizziness. Denies palpitations, CP, swelling or PND/Orthopnea. Struggles with insomnia, has not bought anything OTC for this yet but states melatonin in the hospital worked. Denies SOB. Appetite ok. Weight at home 106-107 pounds. Taking all medications. Denies ETOH, tobacco or drug use. Tries to watch what she eats, avoids salty foods. Stays under 64 oz/fluid. SBP at home 80s-low 100s (log shows mostly 80s-90s).   Hx tobacco use, stopped 10-12 yrs ago.   Past Medical History:  Diagnosis Date   Chronic back pain    Dizziness     Current Outpatient Medications  Medication Sig Dispense Refill   Ascorbic Acid (VITAMIN C PO) Take 1 tablet by mouth daily.     aspirin  EC 81 MG tablet Take 1 tablet (81 mg total) by mouth daily. Swallow whole. 30 tablet 1   CALCIUM -VITAMIN D PO Take 1 tablet by mouth daily.     Cholecalciferol (VITAMIN D) 1.25 MG (50000 UT) CAPS Take 1,000 Units by  mouth once a week.     MAGNESIUM PO Take 1 tablet by mouth daily.     Multiple Vitamin (MULTIVITAMIN WITH MINERALS) TABS Take 1 tablet by mouth daily.     pantoprazole  (PROTONIX ) 40 MG tablet Take 1 tablet (40 mg total) by mouth daily at 12 noon. 30 tablet 0   rosuvastatin  (CRESTOR ) 20 MG tablet Take 1 tablet (20 mg total) by mouth daily. 30 tablet 0   sacubitril -valsartan  (ENTRESTO ) 24-26 MG Take 1 tablet by mouth 2 (two) times daily. 60 tablet 0   spironolactone  (ALDACTONE ) 25 MG tablet Take 1 tablet (25 mg total) by mouth daily. 30 tablet 0   No current facility-administered medications for this encounter.    Allergies  Allergen Reactions   Fluticasone Propionate     Years ago. Reaction was unknown      Social History   Socioeconomic History   Marital status: Divorced    Spouse name: Not on file   Number of children: 1   Years of education: BA   Highest education level: Not on file  Occupational History    Comment: Retired  Tobacco Use   Smoking status: Former   Smokeless tobacco: Never  Advertising Account Planner   Vaping status: Never Used  Substance and Sexual Activity   Alcohol use: Not Currently    Alcohol/week: 1.0 standard drink of alcohol    Types: 1 drink(s) per week   Drug use: No   Sexual  activity: Not on file  Other Topics Concern   Not on file  Social History Narrative   Patient is retired and lives at home alone college education. Patient has one child. Patient right handed.   Caffeine -one daily.   Social Drivers of Corporate Investment Banker Strain: Low Risk  (10/27/2024)   Overall Financial Resource Strain (CARDIA)    Difficulty of Paying Living Expenses: Not very hard  Food Insecurity: No Food Insecurity (10/23/2024)   Hunger Vital Sign    Worried About Running Out of Food in the Last Year: Never true    Ran Out of Food in the Last Year: Never true  Transportation Needs: No Transportation Needs (10/27/2024)   PRAPARE - Scientist, Research (physical Sciences) (Medical): No    Lack of Transportation (Non-Medical): No  Recent Concern: Transportation Needs - Unmet Transportation Needs (10/23/2024)   PRAPARE - Administrator, Civil Service (Medical): Yes    Lack of Transportation (Non-Medical): No  Physical Activity: Not on file  Stress: Not on file  Social Connections: Patient Declined (10/23/2024)   Social Connection and Isolation Panel    Frequency of Communication with Friends and Family: Patient declined    Frequency of Social Gatherings with Friends and Family: Patient declined    Attends Religious Services: Patient declined    Database Administrator or Organizations: Patient declined    Attends Banker Meetings: Patient declined    Marital Status: Patient declined  Intimate Partner Violence: Patient Declined (10/23/2024)   Humiliation, Afraid, Rape, and Kick questionnaire    Fear of Current or Ex-Partner: Patient declined    Emotionally Abused: Patient declined    Physically Abused: Patient declined    Sexually Abused: Patient declined     History reviewed. No pertinent family history.  Vitals:   11/02/24 1036  BP: 100/70  Pulse: 77  SpO2: 97%  Weight: 51.6 kg (113 lb 12.8 oz)  Height: 5' 2 (1.575 m)   Wt Readings from Last 3 Encounters:  11/02/24 51.6 kg (113 lb 12.8 oz)  10/27/24 50.4 kg (111 lb 1.8 oz)  09/22/18 59 kg (130 lb)    PHYSICAL EXAM: General:  well appearing.  No respiratory difficulty. Walked into clinic Neck: JVD flat.  Cor: Regular rate & rhythm. No murmurs. Lungs: coarse L>R Extremities: no edema  Neuro: alert & oriented x 3. Affect pleasant.   ReDs reading: 41 %, abnormal   ECG: NSR, LBBB (reviewed from 10/23/24)   ASSESSMENT & PLAN: Chronic HFrEF - Echo 11/25: EF <20%, LV with GHK, RV normal, LA mildly dilated, mild-mod MR - LHC in #2. RHC with relatively no77mal filling pressures and stable CO/CI.  - cMRI with severe LV dilatation, LVEF 16%, GHK, septal  dyskinesis consistent with LBBB. 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 Start SGLT2i today. Check A1c, BMET, BNP today. Plan to repeat labs at f/u.  - Continue spiro 25 mg daily - Stop Entresto  24-26 mg BID with ongoing dizziness and low BP readings. Start Losartan  25 mg daily. Asked her to continue checking daily.  - GDMT limited by dizziness/hypotension. Scaling back some today as above to allow for more GDMT titration.   CAD - LHC 10/26/24 Prox LAD to Mid LAD lesion is 75% stenosed. Acute Mrg lesion is 95% stenosed. Prox RCA lesion is 30%  stenosed. Mid LAD lesion is 60% stenosed. 2nd Diag lesion is 50% stenosed. Medical mgmt.  - Continue ASA/statin - Denies CP  - LDL 127 11/25. Repeat lipid panel in ~6-8 weeks. If no significant improvement would refer to lipid clinic. Goal LDL <55%.   LBBB - Continue GDMT optimization with plans for CRT placement if EF remains <35% and QRS >150.  - Would update echo in ~3 months   HTN - BP stable today.  - Low readings at home, symptomatic. Changes as above - GDMT limited by hypotension/dizziness.    Referred to HFSW (PCP, Medications, Transportation, ETOH Abuse, Drug Abuse, Insurance, Financial ):  No Refer to Pharmacy: No Refer to Home Health:  No Refer to Advanced Heart Failure Clinic: No (would try to optimize GDMT prior to possible CRT and continue following with [facility]) Refer to General Cardiology: Has f/u scheduled 2/26  Follow up in TOC in ~2 weeks to reassess volume and GDMT tolerance (needs ReDS and BMET).

## 2024-11-02 ENCOUNTER — Telehealth (HOSPITAL_COMMUNITY): Payer: Self-pay

## 2024-11-02 ENCOUNTER — Encounter (HOSPITAL_COMMUNITY): Payer: Self-pay

## 2024-11-02 ENCOUNTER — Ambulatory Visit (HOSPITAL_COMMUNITY)
Admit: 2024-11-02 | Discharge: 2024-11-02 | Disposition: A | Discharge status: Home / Self Care | Source: Ambulatory Visit | Attending: Internal Medicine | Admitting: Internal Medicine

## 2024-11-02 ENCOUNTER — Other Ambulatory Visit (HOSPITAL_COMMUNITY): Payer: Self-pay

## 2024-11-02 VITALS — BP 100/70 | HR 77 | Ht 62.0 in | Wt 113.8 lb

## 2024-11-02 DIAGNOSIS — I5022 Chronic systolic (congestive) heart failure: Secondary | ICD-10-CM | POA: Insufficient documentation

## 2024-11-02 DIAGNOSIS — I10 Essential (primary) hypertension: Secondary | ICD-10-CM

## 2024-11-02 DIAGNOSIS — I2510 Atherosclerotic heart disease of native coronary artery without angina pectoris: Secondary | ICD-10-CM | POA: Diagnosis not present

## 2024-11-02 DIAGNOSIS — Z87891 Personal history of nicotine dependence: Secondary | ICD-10-CM | POA: Diagnosis not present

## 2024-11-02 DIAGNOSIS — Z7982 Long term (current) use of aspirin: Secondary | ICD-10-CM | POA: Insufficient documentation

## 2024-11-02 DIAGNOSIS — I428 Other cardiomyopathies: Secondary | ICD-10-CM | POA: Diagnosis not present

## 2024-11-02 DIAGNOSIS — I447 Left bundle-branch block, unspecified: Secondary | ICD-10-CM | POA: Insufficient documentation

## 2024-11-02 DIAGNOSIS — I110 Hypertensive heart disease with heart failure: Secondary | ICD-10-CM | POA: Diagnosis not present

## 2024-11-02 DIAGNOSIS — G4700 Insomnia, unspecified: Secondary | ICD-10-CM | POA: Diagnosis not present

## 2024-11-02 DIAGNOSIS — Z79899 Other long term (current) drug therapy: Secondary | ICD-10-CM | POA: Insufficient documentation

## 2024-11-02 LAB — BASIC METABOLIC PANEL WITH GFR
Anion gap: 10 (ref 5–15)
BUN: 17 mg/dL (ref 8–23)
CO2: 25 mmol/L (ref 22–32)
Calcium: 9.4 mg/dL (ref 8.9–10.3)
Chloride: 98 mmol/L (ref 98–111)
Creatinine, Ser: 0.8 mg/dL (ref 0.44–1.00)
GFR, Estimated: >60 mL/min (ref >60)
Glucose, Bld: 101 mg/dL — ABNORMAL HIGH (ref 70–99)
Potassium: 4.8 mmol/L (ref 3.5–5.1)
Sodium: 133 mmol/L — ABNORMAL LOW (ref 135–145)

## 2024-11-02 LAB — HEMOGLOBIN A1C
Hgb A1c MFr Bld: 5.5 % (ref 4.8–5.6)
Mean Plasma Glucose: 111 mg/dL

## 2024-11-02 LAB — BRAIN NATRIURETIC PEPTIDE: B Natriuretic Peptide: 548 pg/mL — ABNORMAL HIGH (ref 0.0–100.0)

## 2024-11-02 MED ORDER — LOSARTAN POTASSIUM 25 MG PO TABS: 25.0000 mg | ORAL_TABLET | Freq: Every day | ORAL | 2 refills | Status: DC

## 2024-11-02 MED ORDER — EMPAGLIFLOZIN 10 MG PO TABS: 10.0000 mg | ORAL_TABLET | Freq: Every day | ORAL | 2 refills | Status: DC

## 2024-11-02 NOTE — Progress Notes (Signed)
 ReDS Vest / Clip - 11/02/24 1036       ReDS Vest / Clip   Station Marker A    Ruler Value 28.5    ReDS Value Range High volume overload    ReDS Actual Value 41

## 2024-11-02 NOTE — Patient Instructions (Signed)
 Medication Changes:  STOP Entresto   START Losartan  25 mg Daily  START Jardiance  10 mg Daily  Lab Work:  Labs done today, your results will be available in MyChart, we will contact you for abnormal readings.   Special Instructions // Education:  Do the following things EVERYDAY: Weigh yourself in the morning before breakfast. Write it down and keep it in a log. Take your medicines as prescribed Eat low salt foods--Limit salt (sodium) to 2000 mg per day.  Stay as active as you can everyday Limit all fluids for the day to less than 2 liters   Follow-Up in: Thank you for allowing us  to provider your heart failure care after your recent hospitalization. Please follow-up with us  again in 2 weeks   If you have any questions, issues, or concerns before your next appointment please call our office at [phone], opt. 2 and leave a message for the triage nurse.

## 2024-11-02 NOTE — Telephone Encounter (Signed)
 Advanced Heart Failure Patient Advocate Encounter  Test billing for this patient's current coverage ([employer] Medicare D) returns a $12.15 copay for 90 day supply of Farxiga, Jardiance .  This test claim was processed through [employer]- copay amounts may vary at other pharmacies due to [employer], or as the patient moves through the different stages of their insurance plan.  Rachel DEL, CPhT Rx Patient Advocate Phone: [phone]

## 2024-11-03 ENCOUNTER — Ambulatory Visit: Admitting: Physician Assistant

## 2024-11-03 ENCOUNTER — Ambulatory Visit (HOSPITAL_COMMUNITY): Payer: Self-pay | Admitting: Internal Medicine

## 2024-11-11 ENCOUNTER — Encounter: Payer: Self-pay | Admitting: Medical

## 2024-11-11 ENCOUNTER — Ambulatory Visit: Admitting: Medical

## 2024-11-11 VITALS — BP 116/76 | HR 100 | Temp 98.2°F | Resp 16 | Ht 61.0 in | Wt 111.8 lb

## 2024-11-11 DIAGNOSIS — R051 Acute cough: Secondary | ICD-10-CM | POA: Diagnosis not present

## 2024-11-11 DIAGNOSIS — E871 Hypo-osmolality and hyponatremia: Secondary | ICD-10-CM

## 2024-11-11 DIAGNOSIS — J219 Acute bronchiolitis, unspecified: Secondary | ICD-10-CM | POA: Diagnosis not present

## 2024-11-11 DIAGNOSIS — I5041 Acute combined systolic (congestive) and diastolic (congestive) heart failure: Secondary | ICD-10-CM | POA: Diagnosis not present

## 2024-11-11 DIAGNOSIS — I10 Essential (primary) hypertension: Secondary | ICD-10-CM

## 2024-11-11 DIAGNOSIS — Z87891 Personal history of nicotine dependence: Secondary | ICD-10-CM | POA: Diagnosis not present

## 2024-11-11 DIAGNOSIS — I447 Left bundle-branch block, unspecified: Secondary | ICD-10-CM | POA: Diagnosis not present

## 2024-11-11 MED ORDER — ALBUTEROL SULFATE HFA 108 (90 BASE) MCG/ACT IN AERS: 2.0000 | INHALATION_SPRAY | Freq: Four times a day (QID) | RESPIRATORY_TRACT | 0 refills | Status: AC | PRN

## 2024-11-11 NOTE — Progress Notes (Signed)
 Subjective:  Jessica Davis is a 78 y.o. female who presents for Chief Complaint  Patient presents with   Hospitalization Follow-up    New pt get established. Hospital follow-up. Went to a store Friday and was sprayed with air fresher and now has a cough and feels like she is having bronchi spasms and has fluid in her ears. Declines vaccines today     Jessica Davis is a 78 year old female as a new patient  Prior to recent hospitalization in 10/2024, she notes that she wasn't seeing a doctor or PCP regularly in years.  In November, she was hospitalized for severe heart failure with a left ventricular ejection fraction of 16%. She was discharged on a regimen including Crestor  20 mg, spironolactone , losartan , Jardiance , aspirin , vitamin D, and magnesium. She has no prior history of diabetes and minimal history of hypertension, which she managed independently until recently.  She has a persistent cough that began last week after exposure to a concentrated air freshener spray while shopping. The cough has progressively worsened and is accompanied by 'gushing mucus', fullness in her ears, and sinus congestion. She experiences severe coughing fits and describes a sensation of 'tingling all over' after consuming a potassium-rich soup. No recent fever, body aches, or chills. She regularly monitors her temperature, blood pressure, and weight.  She has a history of smoking, having quit 10-12 years ago after smoking half a pack per day. She lives alone and has not exercised regularly in recent years, though she previously walked three times a week for 30-40 minutes.   From hospital discharge information, Admit date 10/23/2024, discharge day 10/27/2024 Hospital Course: 78/F history of hypertension, now diet controlled presented to the ED with progressive dyspnea on exertion, intermittent chest discomfort for over 1 week, along with PND and orthopnea. -Labs with BNP 5422, troponin 17, creatinine one 0.6,  sodium 133, chest x-ray with interstitial edema and pleural effusions.  Cardiology was consulted.   Assessment and Plan: Acute systolic CHF Echocardiogram shows EF less than 20%. Cardiology consulted. Volume status appears to be improving. Underwent right and left heart catheterization on 11/17. Has two-vessel CAD.  Nonobstructive. Medical management recommended. Hemodynamics appears to be well compensated as well. For now no diuresis.  GDMT limited due to symptoms of dizziness.  Continue Entresto  and Aldactone .   Hypertension Blood pressure improving, diuretics as above, continue Aldactone . Now on Entresto .   GERD. On PPI.   HLD. On Crestor .   Insomnia Continue melatonin.   Consultants:  Cardiology    Procedures performed:  Right and left heart catheterization. Echocardiogram. Cardiac MRI  No other aggravating or relieving factors.    No other c/o.  Past Medical History:  Diagnosis Date   CHF (congestive heart failure) (HCC) 10/2024   Chronic back pain    Dizziness    Hypertension    Current Outpatient Medications on File Prior to Visit  Medication Sig Dispense Refill   Ascorbic Acid (VITAMIN C PO) Take 1 tablet by mouth daily.     aspirin  EC 81 MG tablet Take 1 tablet (81 mg total) by mouth daily. Swallow whole. 30 tablet 1   CALCIUM -VITAMIN D PO Take 1 tablet by mouth daily.     Cholecalciferol (VITAMIN D) 1.25 MG (50000 UT) CAPS Take 1,000 Units by mouth once a week.     empagliflozin  (JARDIANCE ) 10 MG TABS tablet Take 1 tablet (10 mg total) by mouth daily before breakfast. 30 tablet 2   losartan  (COZAAR ) 25 MG  tablet Take 1 tablet (25 mg total) by mouth daily. 30 tablet 2   MAGNESIUM PO Take 1 tablet by mouth daily.     Multiple Vitamin (MULTIVITAMIN WITH MINERALS) TABS Take 1 tablet by mouth daily.     Omega-3 Fatty Acids (OMEGA 3 500 PO) Take by mouth.     pantoprazole  (PROTONIX ) 40 MG tablet Take 1 tablet (40 mg total) by mouth daily at 12 noon. 30  tablet 0   rosuvastatin  (CRESTOR ) 20 MG tablet Take 1 tablet (20 mg total) by mouth daily. 30 tablet 0   spironolactone  (ALDACTONE ) 25 MG tablet Take 1 tablet (25 mg total) by mouth daily. 30 tablet 0   No current facility-administered medications on file prior to visit.     The following portions of the patient's history were reviewed and updated as appropriate: allergies, current medications, past family history, past medical history, past social history, past surgical history and problem list.  ROS Otherwise as in subjective above    Objective: BP 116/76   Pulse 100   Temp 98.2 F (36.8 C)   Resp 16   Ht 5' 1 (1.549 m)   Wt 111 lb 12.8 oz (50.7 kg)   SpO2 95%   BMI 21.12 kg/m   General appearance: alert, no distress, well developed, well nourished, petite white female HEENT: normocephalic, sclerae anicteric, conjunctiva pink and moist, TMs flat, some air-fluid levels noted,, nares patent, no discharge or erythema, pharynx normal Oral cavity: MMM, no lesions Neck: supple, no lymphadenopathy, no thyromegaly, no masses Heart: RRR, normal S1, S2, no murmurs Lungs: Few scattered wheezes, few rhonchi, no rales Pulses: 2+ radial pulses, 2+ pedal pulses, normal cap refill Ext: no edema   Assessment: Encounter Diagnoses  Name Primary?   Acute combined systolic and diastolic heart failure (HCC) Yes   LBBB (left bundle branch block)    Primary hypertension    Hyponatremia    Bronchiolitis    Acute cough    Former smoker      Plan: I reviewed her recent hospital discharge summary, medications reconciled, reviewed studies, labs.  Continue medications for heart failure including Entresto  24/26 mg twice daily, spironolactone  25 mg daily, aspirin  81 mg daily, rosuvastatin  Crestor  20 mg daily  Continue on pantoprazole  40 mg daily for GERD  Due to cough likely bronchiolitis from chemical exposure last week.  Begin albuterol  for the next 5 to 7 days as needed.  Discussed  proper use of medication.  She declines prednisone today.  I gave her samples of Children's Claritin that she can use for the next few days for congestion and mucus.  Advise she avoid over-the-counter decongestants  We discussed the role as primary care  Follow-up with cardiology next week as planned  Jessica Davis was seen today for hospitalization follow-up.  Diagnoses and all orders for this visit:  Acute combined systolic and diastolic heart failure (HCC)  LBBB (left bundle branch block)  Primary hypertension  Hyponatremia -     Basic metabolic panel with GFR  Bronchiolitis  Acute cough  Former smoker  Other orders -     albuterol  (VENTOLIN  HFA) 108 (90 Base) MCG/ACT inhaler; Inhale 2 puffs into the lungs every 6 (six) hours as needed for wheezing or shortness of breath.    Follow up: pending labs

## 2024-11-12 ENCOUNTER — Other Ambulatory Visit: Payer: Self-pay | Admitting: Medical

## 2024-11-12 ENCOUNTER — Ambulatory Visit: Payer: Self-pay | Admitting: Medical

## 2024-11-12 DIAGNOSIS — J90 Pleural effusion, not elsewhere classified: Secondary | ICD-10-CM

## 2024-11-12 DIAGNOSIS — E871 Hypo-osmolality and hyponatremia: Secondary | ICD-10-CM

## 2024-11-12 DIAGNOSIS — I10 Essential (primary) hypertension: Secondary | ICD-10-CM

## 2024-11-12 DIAGNOSIS — I509 Heart failure, unspecified: Secondary | ICD-10-CM

## 2024-11-12 LAB — BASIC METABOLIC PANEL WITH GFR
BUN/Creatinine Ratio: 26 (ref 12–28)
BUN: 22 mg/dL (ref 8–27)
CO2: 21 mmol/L (ref 20–29)
Calcium: 9.7 mg/dL (ref 8.7–10.3)
Chloride: 91 mmol/L — ABNORMAL LOW (ref 96–106)
Creatinine, Ser: 0.84 mg/dL (ref 0.57–1.00)
Glucose: 92 mg/dL (ref 70–99)
Potassium: 4.9 mmol/L (ref 3.5–5.2)
Sodium: 128 mmol/L — ABNORMAL LOW (ref 134–144)
eGFR: 71 mL/min/1.73 (ref >59)

## 2024-11-12 NOTE — Progress Notes (Signed)
 Labs show sodium lower than prior.  Electrolytes otherwise okay.  Lets go ahead and get that updated chest x-ray now.  Continue your current medications and start the medicines prescribed yesterday to help with your symptoms of cough  Please go to [facility] for your chest xray.   Their hours are 8am - 4:30 pm Monday - Friday.  Take your insurance card with you.  [facility] [phone]  [address] [place], [place] [address]

## 2024-11-13 ENCOUNTER — Telehealth (HOSPITAL_COMMUNITY): Payer: Self-pay

## 2024-11-13 NOTE — Telephone Encounter (Signed)
 Called to confirm/remind patient of their appointment at the Advanced Heart Failure Clinic on 11/16/24 10:15.   Appointment:   [x] Confirmed  [] Left mess   [] No answer/No voice mail  [] VM Full/unable to leave message  [] Phone not in service  Patient reminded to bring all medications and/or complete list.  Confirmed patient has transportation. Gave directions, instructed to utilize valet parking.

## 2024-11-16 ENCOUNTER — Ambulatory Visit (HOSPITAL_COMMUNITY): Payer: Self-pay | Admitting: Internal Medicine

## 2024-11-16 ENCOUNTER — Ambulatory Visit (HOSPITAL_COMMUNITY): Admission: RE | Admit: 2024-11-16 | Discharge: 2024-11-16 | Attending: Internal Medicine

## 2024-11-16 ENCOUNTER — Encounter (HOSPITAL_COMMUNITY): Payer: Self-pay

## 2024-11-16 VITALS — BP 118/76 | HR 90 | Ht 61.0 in | Wt 110.6 lb

## 2024-11-16 DIAGNOSIS — I5022 Chronic systolic (congestive) heart failure: Secondary | ICD-10-CM

## 2024-11-16 DIAGNOSIS — I447 Left bundle-branch block, unspecified: Secondary | ICD-10-CM | POA: Diagnosis not present

## 2024-11-16 DIAGNOSIS — I10 Essential (primary) hypertension: Secondary | ICD-10-CM | POA: Diagnosis not present

## 2024-11-16 DIAGNOSIS — I2510 Atherosclerotic heart disease of native coronary artery without angina pectoris: Secondary | ICD-10-CM | POA: Diagnosis not present

## 2024-11-16 LAB — BASIC METABOLIC PANEL WITH GFR
Anion gap: 9 (ref 5–15)
BUN: 19 mg/dL (ref 8–23)
CO2: 27 mmol/L (ref 22–32)
Calcium: 9.2 mg/dL (ref 8.9–10.3)
Chloride: 96 mmol/L — ABNORMAL LOW (ref 98–111)
Creatinine, Ser: 0.98 mg/dL (ref 0.44–1.00)
GFR, Estimated: 59 mL/min — ABNORMAL LOW (ref >60)
Glucose, Bld: 98 mg/dL (ref 70–99)
Potassium: 4.7 mmol/L (ref 3.5–5.1)
Sodium: 132 mmol/L — ABNORMAL LOW (ref 135–145)

## 2024-11-16 MED ORDER — FUROSEMIDE 40 MG PO TABS: 40.0000 mg | ORAL_TABLET | Freq: Every day | ORAL | 3 refills | Status: AC

## 2024-11-16 NOTE — Patient Instructions (Signed)
 STOP Jardiance .  START Lasix  40 mg daily.  Labs done today, your results will be available in MyChart, we will contact you for abnormal readings.  Repeat Blood work in 1 week.  Thank you for allowing us  to provider your heart failure care after your recent hospitalization. Please follow-up in 2 weeks.  If you have any questions, issues, or concerns before your next appointment please call our office at [phone], opt. 2 and leave a message for the triage nurse.

## 2024-11-16 NOTE — Progress Notes (Signed)
 ReDS Vest / Clip - 11/16/24 1005       ReDS Vest / Clip   Station Marker A    Ruler Value 29.5    ReDS Value Range High volume overload    ReDS Actual Value 40

## 2024-11-16 NOTE — Progress Notes (Signed)
 HEART IMPACT TRANSITIONS OF CARE    PCP: Bulah Alm RAMAN, PA-C  Primary Cardiologist: Emeline FORBES Calender, DO   Chief Complaint: HFrEF   HPI: Admitted 11/25 with intermittent chest pain and SOB. Echo showed EF <20%, LV with GHK, RV normal, LA mildly dilated, mild-mod MR. LHC 10/26/24 Prox LAD to Mid LAD lesion is 75% stenosed. Acute Mrg lesion is 95% stenosed. Prox RCA lesion is 30% stenosed. Mid LAD lesion is 60% stenosed. 2nd Diag lesion is 50% stenosed. Medical mgmt. RHC with relatively normal filling pressures and stable CO/CI. cMRI with severe LV dilatation, LVEF 16%, GHK, septal dyskinesis consistent with LBBB. 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 Diuresed with IV lasix . GDMT was limited with dizziness/hypotension. Discharged with spiro and Entresto .    Today she returns for AHF TOC follow up. Overall feeling ok, concerned about recent cough and congestion. States started after starting Losartan  and Jardiance . Denies palpitations, CP, dizziness, edema, or PND/Orthopnea. Chest sore from coughing. SOB with current congestion. Appetite ok, watches what she eats. No fever or chills, denies being around any sick contacts. Weight at home 104-105 pounds. Taking all medications. Denies ETOH, tobacco or drug use.  Tries to watch what she eats, avoids salty foods. Stays under 64 oz/fluid. Says her sugars were dropping at home (does not check), but symptoms resolved with peanuts. SBP at home 90s-110s.    Hx tobacco use, stopped 10-12 yrs ago.   ROS: All systems negative except as listed in HPI, PMH and Problem List.  SH:  Social History   Socioeconomic History   Marital status: Divorced    Spouse name: Not on file   Number of children: 1   Years of education: BA   Highest education level: Bachelor's degree (e.g., BA, AB, BS)  Occupational History    Comment: Retired  Tobacco Use   Smoking status: Former   Smokeless tobacco: Never   Tobacco comments:    Quit 2015,  former 1/2 ppd  Vaping Use   Vaping status: Never Used  Substance and Sexual Activity   Alcohol use: Not Currently    Alcohol/week: 1.0 standard drink of alcohol    Types: 1 drink(s) per week   Drug use: No   Sexual activity: Not on file  Other Topics Concern   Not on file  Social History Narrative   Patient is retired and lives at home alone college education. Patient has one child. Patient right handed.Caffeine -one daily.     Social Drivers of Health   Financial Resource Strain: Patient Declined (11/10/2024)   Overall Financial Resource Strain (CARDIA)    Difficulty of Paying Living Expenses: Patient declined  Food Insecurity: Patient Declined (11/10/2024)   Hunger Vital Sign    Worried About Running Out of Food in the Last Year: Patient declined    Ran Out of Food in the Last Year: Patient declined  Transportation Needs: Unmet Transportation Needs (11/10/2024)   PRAPARE - Administrator, Civil Service (Medical): Yes    Lack of Transportation (Non-Medical): Yes  Physical Activity: Insufficiently Active (11/10/2024)   Exercise Vital Sign    Days of Exercise per Week: 3 days    Minutes of Exercise per Session: 30 min  Stress: Stress Concern Present (11/10/2024)   [employer] of Occupational Health - Occupational Stress Questionnaire    Feeling of Stress: Very much  Social Connections: Unknown (11/10/2024)   Social Connection and Isolation Panel    Frequency of Communication  with Friends and Family: Patient declined    Frequency of Social Gatherings with Friends and Family: Patient declined    Attends Religious Services: Patient declined    Database Administrator or Organizations: Patient declined    Attends Banker Meetings: Not on file    Marital Status: Divorced  Intimate Partner Violence: Patient Declined (10/23/2024)   Humiliation, Afraid, Rape, and Kick questionnaire    Fear of Current or Ex-Partner: Patient declined    Emotionally Abused:  Patient declined    Physically Abused: Patient declined    Sexually Abused: Patient declined    FH: History reviewed. No pertinent family history.  Past Medical History:  Diagnosis Date   CHF (congestive heart failure) (HCC) 10/2024   Chronic back pain    Dizziness    Hypertension     Current Outpatient Medications  Medication Sig Dispense Refill   albuterol  (VENTOLIN  HFA) 108 (90 Base) MCG/ACT inhaler Inhale 2 puffs into the lungs every 6 (six) hours as needed for wheezing or shortness of breath. 8 g 0   Ascorbic Acid (VITAMIN C PO) Take 1 tablet by mouth daily.     aspirin  EC 81 MG tablet Take 1 tablet (81 mg total) by mouth daily. Swallow whole. 30 tablet 1   CALCIUM -VITAMIN D PO Take 1 tablet by mouth daily.     Cholecalciferol (VITAMIN D) 1.25 MG (50000 UT) CAPS Take 1,000 Units by mouth once a week.     empagliflozin  (JARDIANCE ) 10 MG TABS tablet Take 1 tablet (10 mg total) by mouth daily before breakfast. 30 tablet 2   losartan  (COZAAR ) 25 MG tablet Take 1 tablet (25 mg total) by mouth daily. 30 tablet 2   MAGNESIUM PO Take 1 tablet by mouth daily.     Multiple Vitamin (MULTIVITAMIN WITH MINERALS) TABS Take 1 tablet by mouth daily.     Omega-3 Fatty Acids (OMEGA 3 500 PO) Take by mouth.     pantoprazole  (PROTONIX ) 40 MG tablet Take 1 tablet (40 mg total) by mouth daily at 12 noon. 30 tablet 0   rosuvastatin  (CRESTOR ) 20 MG tablet Take 1 tablet (20 mg total) by mouth daily. 30 tablet 0   spironolactone  (ALDACTONE ) 25 MG tablet Take 1 tablet (25 mg total) by mouth daily. 30 tablet 0   No current facility-administered medications for this encounter.    Vitals:   11/16/24 1005  BP: 118/76  Pulse: 90  SpO2: 95%  Weight: 50.2 kg (110 lb 9.6 oz)  Height: 5' 1 (1.549 m)    PHYSICAL EXAM: General:  elderly appearing. +cough Neck: JVD ~8 cm.  Cor: Regular rate & rhythm. No murmurs. Lungs: coarse bases Extremities: no edema  Neuro: alert & oriented x 3. Affect pleasant.    Wt Readings from Last 3 Encounters:  11/16/24 50.2 kg (110 lb 9.6 oz)  11/11/24 50.7 kg (111 lb 12.8 oz)  11/02/24 51.6 kg (113 lb 12.8 oz)     ECG: none today  ReDs reading: 40 %, abnormal  ASSESSMENT & PLAN: Chronic HFrEF - Echo 11/25: EF <20%, LV with GHK, RV normal, LA mildly dilated, mild-mod MR - LHC in #2. RHC with relatively no21mal filling pressures and stable CO/CI.  - cMRI with severe LV dilatation, LVEF 16%, GHK, septal dyskinesis consistent with LBBB. 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 Patient requesting to restart lasix  and stop Jardiance . Start  lasix  40 mg daily. BMET today, repeat BMET in 1 week.  - Stop Jardiance  per patients request. Getting CXR today. Discussed switching over to Farxiga at next visit if symptoms resolve with lasix . (Also discussed keeping SGLT2i and adding lasix  but she attributes her symptoms to Jardiance  and would like to be off for now). Does not report increased UOP with SGLT2i. - Continue spiro 25 mg daily - Continue Losartan  25 mg daily. Asked her to continue checking daily. (Off Entresto  with hypotension) - GDMT limited by soft BP.    CAD - LHC 10/26/24 Prox LAD to Mid LAD lesion is 75% stenosed. Acute Mrg lesion is 95% stenosed. Prox RCA lesion is 30% stenosed. Mid LAD lesion is 60% stenosed. 2nd Diag lesion is 50% stenosed. Medical mgmt.  - Continue ASA/statin - Denies CP  - LDL 127 11/25. Repeat lipid panel in ~6-8 weeks. If no significant improvement would refer to lipid clinic. Goal LDL <55%.    LBBB - Continue GDMT optimization with plans for CRT placement if EF remains <35% and QRS >150.  - Would update echo in ~3 months    HTN - BP stable today.  - GDMT limited by hypotension.   Will bring back one more time to [facility] (2 weeks) to ensure symptoms have resolved and may be able to start Farxiga.   Has f/u scheduled with [facility] 2/26

## 2024-11-17 ENCOUNTER — Telehealth (HOSPITAL_COMMUNITY): Payer: Self-pay | Admitting: Cardiology

## 2024-11-17 NOTE — Telephone Encounter (Signed)
 Patient called to report She has decided to not stop jardiance  like discussed at last OV  Reports she is willing to continue for the heart benefits  Please advise if additional changes are needed otherwise FYI

## 2024-11-18 ENCOUNTER — Ambulatory Visit
Admission: RE | Admit: 2024-11-18 | Discharge: 2024-11-18 | Disposition: A | Discharge status: Home / Self Care | Source: Ambulatory Visit | Attending: Medical | Admitting: Medical

## 2024-11-18 DIAGNOSIS — E871 Hypo-osmolality and hyponatremia: Secondary | ICD-10-CM

## 2024-11-18 DIAGNOSIS — I509 Heart failure, unspecified: Secondary | ICD-10-CM

## 2024-11-18 DIAGNOSIS — I10 Essential (primary) hypertension: Secondary | ICD-10-CM

## 2024-11-18 DIAGNOSIS — J90 Pleural effusion, not elsewhere classified: Secondary | ICD-10-CM

## 2024-11-19 MED ORDER — EMPAGLIFLOZIN 10 MG PO TABS: 10.0000 mg | ORAL_TABLET | Freq: Every day | ORAL | 11 refills | Status: AC

## 2024-11-19 NOTE — Telephone Encounter (Signed)
 Pt aware.

## 2024-11-23 ENCOUNTER — Telehealth: Payer: Self-pay

## 2024-11-23 ENCOUNTER — Ambulatory Visit (HOSPITAL_COMMUNITY): Admission: RE | Admit: 2024-11-23 | Discharge: 2024-11-23 | Attending: Cardiology

## 2024-11-23 DIAGNOSIS — I5022 Chronic systolic (congestive) heart failure: Secondary | ICD-10-CM

## 2024-11-23 LAB — BASIC METABOLIC PANEL WITH GFR
Anion gap: 12 (ref 5–15)
BUN: 21 mg/dL (ref 8–23)
CO2: 28 mmol/L (ref 22–32)
Calcium: 9.6 mg/dL (ref 8.9–10.3)
Chloride: 95 mmol/L — ABNORMAL LOW (ref 98–111)
Creatinine, Ser: 0.88 mg/dL (ref 0.44–1.00)
GFR, Estimated: >60 mL/min (ref >60)
Glucose, Bld: 93 mg/dL (ref 70–99)
Potassium: 4.5 mmol/L (ref 3.5–5.1)
Sodium: 135 mmol/L (ref 135–145)

## 2024-11-23 MED ORDER — ROSUVASTATIN CALCIUM 20 MG PO TABS: 20.0000 mg | ORAL_TABLET | Freq: Every day | ORAL | 0 refills | Status: DC

## 2024-11-23 MED ORDER — PANTOPRAZOLE SODIUM 40 MG PO TBEC: 40.0000 mg | DELAYED_RELEASE_TABLET | Freq: Every day | ORAL | 0 refills | Status: AC

## 2024-11-23 MED ORDER — SPIRONOLACTONE 25 MG PO TABS: 25.0000 mg | ORAL_TABLET | Freq: Every day | ORAL | 0 refills | Status: DC

## 2024-11-23 NOTE — Telephone Encounter (Signed)
 Error

## 2024-11-24 ENCOUNTER — Ambulatory Visit: Payer: Self-pay | Admitting: Medical

## 2024-11-24 NOTE — Progress Notes (Signed)
 Results through MyChart

## 2024-11-27 ENCOUNTER — Telehealth (HOSPITAL_COMMUNITY): Payer: Self-pay

## 2024-11-27 NOTE — Telephone Encounter (Signed)
 Called to confirm/remind patient of their appointment [facility] on 11/30/24 11:45.   Appointment:   [] Confirmed  [] Left mess   [] No answer/No voice mail  [] VM Full/unable to leave message  [] Phone not in service  Patient reminded to bring all medications and/or complete list.  Confirmed patient has transportation. Gave directions, instructed to utilize valet parking.

## 2024-11-30 ENCOUNTER — Encounter (HOSPITAL_COMMUNITY): Payer: Self-pay

## 2024-11-30 ENCOUNTER — Ambulatory Visit (HOSPITAL_COMMUNITY)
Admission: RE | Admit: 2024-11-30 | Discharge: 2024-11-30 | Disposition: A | Discharge status: Home / Self Care | Source: Ambulatory Visit | Attending: Cardiology | Admitting: Cardiology

## 2024-11-30 VITALS — BP 100/60 | HR 80 | Wt 108.8 lb

## 2024-11-30 DIAGNOSIS — Z7982 Long term (current) use of aspirin: Secondary | ICD-10-CM | POA: Insufficient documentation

## 2024-11-30 DIAGNOSIS — Z7984 Long term (current) use of oral hypoglycemic drugs: Secondary | ICD-10-CM | POA: Diagnosis not present

## 2024-11-30 DIAGNOSIS — I5022 Chronic systolic (congestive) heart failure: Secondary | ICD-10-CM | POA: Diagnosis not present

## 2024-11-30 DIAGNOSIS — I2510 Atherosclerotic heart disease of native coronary artery without angina pectoris: Secondary | ICD-10-CM | POA: Insufficient documentation

## 2024-11-30 DIAGNOSIS — Z79899 Other long term (current) drug therapy: Secondary | ICD-10-CM | POA: Diagnosis not present

## 2024-11-30 DIAGNOSIS — I447 Left bundle-branch block, unspecified: Secondary | ICD-10-CM | POA: Diagnosis not present

## 2024-11-30 DIAGNOSIS — Z87891 Personal history of nicotine dependence: Secondary | ICD-10-CM | POA: Diagnosis not present

## 2024-11-30 DIAGNOSIS — I110 Hypertensive heart disease with heart failure: Secondary | ICD-10-CM | POA: Insufficient documentation

## 2024-11-30 DIAGNOSIS — Z5982 Transportation insecurity: Secondary | ICD-10-CM | POA: Diagnosis not present

## 2024-11-30 MED ORDER — LOSARTAN POTASSIUM 25 MG PO TABS: 12.5000 mg | ORAL_TABLET | Freq: Every day | ORAL | Status: AC

## 2024-11-30 NOTE — Progress Notes (Signed)
 "   HEART IMPACT TRANSITIONS OF CARE    PCP: Bulah Alm RAMAN, PA-C  Primary Cardiologist: Emeline FORBES Calender, DO  Chief Complaint: HFrEF   HPI: Admitted 11/25 with intermittent chest pain and SOB. Echo showed EF <20%, LV with GHK, RV normal, LA mildly dilated, mild-mod MR. LHC 10/26/24 Prox LAD to Mid LAD lesion is 75% stenosed. Acute Mrg lesion is 95% stenosed. Prox RCA lesion is 30% stenosed. Mid LAD lesion is 60% stenosed. 2nd Diag lesion is 50% stenosed. Medical mgmt. RHC with relatively normal filling pressures and stable CO/CI. cMRI with severe LV dilatation, LVEF 16%, GHK, septal dyskinesis consistent with LBBB. 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 Diuresed with IV lasix . GDMT was limited with dizziness/hypotension. Discharged with spiro and Entresto .    Today she returns for HF TOC follow up. She is now off of Entresto  and taking Losartan . Switched d/t low BP.   She presents today for further med adjustment. Reports doing well. Denies resting dyspnea. No LEE, orthopnea/PND. She reports stable NYHA Class II symptoms. No CP.   Her main concern is low BP. BP 100/60 today in clinic but typically running in the 90s/40s at home. Denies orthostatic symptoms.   Of note, she had f/u labs done last wk, BMP showed normal K at 4.5, Scr 0.88.    Hx tobacco use, stopped 10-12 yrs ago.   ROS: All systems negative except as listed in HPI, PMH and Problem List.  SH:  Social History   Socioeconomic History   Marital status: Divorced    Spouse name: Not on file   Number of children: 1   Years of education: BA   Highest education level: Bachelor's degree (e.g., BA, AB, BS)  Occupational History    Comment: Retired  Tobacco Use   Smoking status: Former   Smokeless tobacco: Never   Tobacco comments:    Quit 2015, former 1/2 ppd  Vaping Use   Vaping status: Never Used  Substance and Sexual Activity   Alcohol use: Not Currently    Alcohol/week: 1.0 standard drink of  alcohol    Types: 1 drink(s) per week   Drug use: No   Sexual activity: Not on file  Other Topics Concern   Not on file  Social History Narrative   Patient is retired and lives at home alone college education. Patient has one child. Patient right handed.Caffeine -one daily.     Social Drivers of Health   Tobacco Use: Medium Risk (11/16/2024)   Patient History    Smoking Tobacco Use: Former    Smokeless Tobacco Use: Never    Passive Exposure: Not on file  Financial Resource Strain: Patient Declined (11/10/2024)   Overall Financial Resource Strain (CARDIA)    Difficulty of Paying Living Expenses: Patient declined  Food Insecurity: Patient Declined (11/10/2024)   Epic    Worried About Programme Researcher, Broadcasting/film/video in the Last Year: Patient declined    Barista in the Last Year: Patient declined  Transportation Needs: Unmet Transportation Needs (11/10/2024)   Epic    Lack of Transportation (Medical): Yes    Lack of Transportation (Non-Medical): Yes  Physical Activity: Insufficiently Active (11/10/2024)   Exercise Vital Sign    Days of Exercise per Week: 3 days    Minutes of Exercise per Session: 30 min  Stress: Stress Concern Present (11/10/2024)   [employer] of Occupational Health - Occupational Stress Questionnaire    Feeling of Stress: Very much  Social Connections: Unknown (11/10/2024)   Social Connection and Isolation Panel    Frequency of Communication with Friends and Family: Patient declined    Frequency of Social Gatherings with Friends and Family: Patient declined    Attends Religious Services: Patient declined    Active Member of Clubs or Organizations: Patient declined    Attends Banker Meetings: Not on file    Marital Status: Divorced  Intimate Partner Violence: Patient Declined (10/23/2024)   Epic    Fear of Current or Ex-Partner: Patient declined    Emotionally Abused: Patient declined    Physically Abused: Patient declined    Sexually Abused:  Patient declined  Depression (PHQ2-9): Low Risk (11/11/2024)   Depression (PHQ2-9)    PHQ-2 Score: 0  Alcohol Screen: Low Risk (10/27/2024)   Alcohol Screen    Last Alcohol Screening Score (AUDIT): 0  Housing: Unknown (11/10/2024)   Epic    Unable to Pay for Housing in the Last Year: No    Number of Times Moved in the Last Year: Not on file    Homeless in the Last Year: No  Utilities: Not At Risk (10/23/2024)   Epic    Threatened with loss of utilities: No  Health Literacy: Not on file    FH: No family history on file.  Past Medical History:  Diagnosis Date   CHF (congestive heart failure) (HCC) 10/2024   Chronic back pain    Dizziness    Hypertension     Current Outpatient Medications  Medication Sig Dispense Refill   albuterol  (VENTOLIN  HFA) 108 (90 Base) MCG/ACT inhaler Inhale 2 puffs into the lungs every 6 (six) hours as needed for wheezing or shortness of breath. 8 g 0   Ascorbic Acid (VITAMIN C PO) Take 1 tablet by mouth daily.     aspirin  EC 81 MG tablet Take 1 tablet (81 mg total) by mouth daily. Swallow whole. 30 tablet 1   CALCIUM -VITAMIN D PO Take 1 tablet by mouth daily.     Cholecalciferol (VITAMIN D) 1.25 MG (50000 UT) CAPS Take 1,000 Units by mouth once a week.     empagliflozin  (JARDIANCE ) 10 MG TABS tablet Take 1 tablet (10 mg total) by mouth daily before breakfast. 30 tablet 11   furosemide  (LASIX ) 40 MG tablet Take 1 tablet (40 mg total) by mouth daily. 90 tablet 3   losartan  (COZAAR ) 25 MG tablet Take 1 tablet (25 mg total) by mouth daily. 30 tablet 2   MAGNESIUM PO Take 1 tablet by mouth daily.     Multiple Vitamin (MULTIVITAMIN WITH MINERALS) TABS Take 1 tablet by mouth daily.     Omega-3 Fatty Acids (OMEGA 3 500 PO) Take by mouth.     pantoprazole  (PROTONIX ) 40 MG tablet Take 1 tablet (40 mg total) by mouth daily at 12 noon. 30 tablet 0   rosuvastatin  (CRESTOR ) 20 MG tablet Take 1 tablet (20 mg total) by mouth daily. 30 tablet 0   spironolactone   (ALDACTONE ) 25 MG tablet Take 1 tablet (25 mg total) by mouth daily. 30 tablet 0   No current facility-administered medications for this encounter.    Vitals:   11/30/24 1141  BP: 100/60  Pulse: 80  SpO2: 93%  Weight: 49.4 kg (108 lb 12.8 oz)   PHYSICAL EXAM: GENERAL: NAD Lungs- clear  CARDIAC:  JVP not elevated          Normal rate with regular rhythm. No MRG. No LEE  ABDOMEN: Soft, non-tender, non-distended.  EXTREMITIES: Warm and well perfused.  NEUROLOGIC: No obvious FND    ECG: none today   ASSESSMENT & PLAN: Chronic HFrEF - Echo 11/25: EF <20%, LV with GHK, RV normal, LA mildly dilated, mild-mod MR - LHC in #2. RHC with relatively normal filling pressures and stable CO/CI.  - cMRI with severe LV dilatation, LVEF 16%, GHK, septal dyskinesis consistent with LBBB. 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 Will reduce Losartan  down to 12.5 mg daily d/t low BP  - Continue Lasix  40 mg daily. Reviewed labs, SCr and K stable    CAD - LHC 10/26/24 Prox LAD to Mid LAD lesion is 75% stenosed. Acute Mrg lesion is 95% stenosed. Prox RCA lesion is 30% stenosed. Mid LAD lesion is 60% stenosed. 2nd Diag lesion is 50% stenosed. Medical mgmt.  - stable w/o CP  - Continue ASA/statin - LDL 127 11/25. Repeat lipid panel in ~6-8 weeks. If no significant improvement would refer to lipid clinic. Goal LDL <55%. Will defer to [facility].    LBBB - Continue GDMT optimization with plans for CRT placement if EF remains <35% and QRS >150.  - Would update echo in ~3 months    HTN - soft but no orthostatic symptoms, reduce losartan  per above   Keep w/u w/ Cardiology in 8 wks.   Has f/u scheduled with [facility] 2/26 "

## 2024-11-30 NOTE — Patient Instructions (Signed)
 Medication Changes:  DECREASE Losartan  to 12.5 mg (1/2 tab) daily   Special Instructions // Education:  Do the following things EVERYDAY: Weigh yourself in the morning before breakfast. Write it down and keep it in a log. Take your medicines as prescribed Eat low salt foods--Limit salt (sodium) to 2000 mg per day.  Stay as active as you can everyday Limit all fluids for the day to less than 2 liters   Follow-Up in: Thank you for allowing us  to provider your heart failure care after your recent hospitalization. Please follow-up with cardiology as scheduled   If you have any questions, issues, or concerns before your next appointment please call our office at [phone], opt. [phone] and leave a message for the triage nurse.

## 2024-12-08 NOTE — Telephone Encounter (Signed)
 Insurance is rejecting Protonix  and wants to change her to omeprazole.  Has she had prior problems with omeprazole?

## 2024-12-15 ENCOUNTER — Telehealth: Payer: Self-pay | Admitting: Internal Medicine

## 2024-12-15 NOTE — Telephone Encounter (Signed)
 Jessica Davis

## 2024-12-23 ENCOUNTER — Other Ambulatory Visit: Payer: Self-pay | Admitting: Medical

## 2024-12-28 ENCOUNTER — Ambulatory Visit: Admitting: Internal Medicine

## 2025-01-27 ENCOUNTER — Ambulatory Visit: Admitting: Internal Medicine

## 2025-03-26 ENCOUNTER — Ambulatory Visit: Admitting: Internal Medicine
